# Patient Record
Sex: Male | Born: 1985 | Race: Black or African American | Hispanic: No | Marital: Married | Smoking: Current every day smoker
Health system: Southern US, Community
[De-identification: ages and names within clinical notes are randomized; demographics above are authoritative.]

## PROBLEM LIST (undated history)

## (undated) DIAGNOSIS — F319 Bipolar disorder, unspecified: Secondary | ICD-10-CM

---

## 2013-12-16 ENCOUNTER — Inpatient Hospital Stay
Admit: 2013-12-16 | Discharge: 2013-12-16 | Disposition: A | Discharge status: Home / Self Care | Attending: Family Medicine

## 2013-12-16 DIAGNOSIS — A64 Unspecified sexually transmitted disease: Secondary | ICD-10-CM

## 2013-12-16 LAB — URINALYSIS WITH MICROSCOPIC
Bilirubin Urine: NEGATIVE
Blood, Urine: NEGATIVE
Casts: NONE SEEN /lpf
Casts: NONE SEEN /lpf
Crystals: NONE SEEN
Glucose, Urine: NEGATIVE mg/dl
Ketones, Urine: NEGATIVE
Miscellaneous Lab Test Result: NONE SEEN
Nitrite, Urine: NEGATIVE
Protein, UA: NEGATIVE mg/dl
Renal Epithelial, UA: NONE SEEN
Specific Gravity, UA: 1.026 (ref 1.002–1.03)
Urobilinogen, Urine: 1 eu/dl (ref 0.0–1.0)
WBC, UA: >200 /hpf (ref 0–?)
Yeast, UA: NONE SEEN
pH, UA: 6 (ref 5.0–9.0)

## 2013-12-16 MED ORDER — AZITHROMYCIN 250 MG PO TABS
250 MG | Freq: Once | ORAL | Status: AC
Start: 2013-12-16 — End: 2013-12-16
  Administered 2013-12-16: 17:00:00 1000 mg via ORAL

## 2013-12-16 MED ORDER — LIDOCAINE HCL (PF) 1 % IJ SOLN
1 % | INTRAMUSCULAR | Status: AC
Start: 2013-12-16 — End: 2013-12-16
  Administered 2013-12-16: 17:00:00 2

## 2013-12-16 MED ORDER — CEFTRIAXONE SODIUM 250 MG IJ SOLR
250 MG | Freq: Once | INTRAMUSCULAR | Status: AC
Start: 2013-12-16 — End: 2013-12-16
  Administered 2013-12-16: 17:00:00 250 mg via INTRAMUSCULAR

## 2013-12-16 MED FILL — CEFTRIAXONE SODIUM 250 MG IJ SOLR: 250 MG | INTRAMUSCULAR | Qty: 250

## 2013-12-16 MED FILL — XYLOCAINE-MPF 1 % IJ SOLN: 1 % | INTRAMUSCULAR | Qty: 2

## 2013-12-16 MED FILL — AZITHROMYCIN 250 MG PO TABS: 250 MG | ORAL | Qty: 4

## 2013-12-16 NOTE — ED Provider Notes (Signed)
[facility] EM[facility]      CHIEF COMPLAINT       Chief Complaint   Patient presents with   ??? Penile Discharge     penile swelling, yellow penile discharge       Nurses Notes reviewed and I agree except as noted in the HPI.      HISTORY OF PRESENT ILLNESS    Evan Fischer is a 28 y.o. male who presents with complaints of painful urinating as well as increased urination and whitish discharge from the urethral meatus.  The patient denies any fever, night sweats or shaking chills this time and further denies any nausea, vomiting or diarrhea.  The patient states he has been diagnosed with gonorrhea in the past and that these symptoms are identical in presentation.  The patient denies any blood from the urethral meatus and states he is still able to urinate albeit with pain.  The patient denies any abdominal pain otherwise.    REVIEW OF SYSTEMS     Review of Systems   Constitutional: Negative.    HENT: Negative.    Eyes: Negative.    Respiratory: Negative.    Cardiovascular: Negative.    Gastrointestinal: Negative.    Endocrine: Negative.    Genitourinary: Positive for dysuria, urgency and discharge.   Neurological: Negative.    Hematological: Negative.    Psychiatric/Behavioral: Negative.         PAST MEDICAL HISTORY    has no past medical history on file.    SURGICAL HISTORY      has no past surgical history on file.    CURRENT MEDICATIONS       Previous Medications    No medications on file       ALLERGIES     has No Known Allergies.    FAMILY HISTORY     has no family status information on file.  family history is not on file.    SOCIAL HISTORY          PHYSICAL EXAM     INITIAL VITALS:  height is 5\' 7"  (1.702 m) and weight is 165 lb (74.844 kg). His oral temperature is 97.9 ??F (36.6 ??C). His blood pressure is 153/82 and his pulse is 79. His respiration is 16 and oxygen saturation is 98%.    Physical Exam   Constitutional: He is oriented to person, place, and time. He appears well-developed and  well-nourished. No distress.   HENT:   Head: Normocephalic and atraumatic.   Eyes: Pupils are equal, round, and reactive to light.   Neck: Neck supple.   Cardiovascular: Normal rate, regular rhythm and normal heart sounds.  Exam reveals no gallop and no friction rub.    No murmur heard.  Pulmonary/Chest: Effort normal and breath sounds normal. No respiratory distress. He has no wheezes. He has no rales. He exhibits no tenderness.   Abdominal: Soft. Bowel sounds are normal. He exhibits no distension and no mass. There is no tenderness. There is no rebound and no guarding.   Genitourinary:   Mild penile swelling with whitish discharge appreciated at urethral meatus.  No vesicles appreciated on examination or tender lymphadenopathy.   Musculoskeletal: Normal range of motion.   Neurological: He is alert and oriented to person, place, and time.   Skin: Skin is warm. He is not diaphoretic.       DIFFERENTIAL DIAGNOSIS:   Gonorrhea, chlamydia, herpes, Trichomonas, other concern    DIAGNOSTIC RESULTS     EKG: All  EKG's are interpreted by the Emergency Department Physician who either signs or Co-signs this chart in the absence of a cardiologist.      RADIOLOGY: non-plain film images(s) such as CT, Ultrasound and MRI are read by the radiologist.  Plain radiographic images are visualized and preliminarily interpreted by the emergency physician unless otherwise stated below.      LABS:   Labs Reviewed   URINALYSIS WITH MICROSCOPIC - Abnormal; Notable for the following:     Leukocyte Esterase, Urine LARGE (*)     Character, Urine CLOUDY (*)     All other components within normal limits   NEISSERIA GONORRHOEAE, NAA   CHLAMYDIA TRACHOMATIS, NAA       EMERGENCY DEPARTMENT COURSE:   Vitals:    Filed Vitals:    12/16/13 1105   BP: 153/82   Pulse: 79   Temp: 97.9 ??F (36.6 ??C)   TempSrc: Oral   Resp: 16   Height: 5\' 7"  (1.702 m)   Weight: 165 lb (74.844 kg)   SpO2: 98%     From the patient is most likely suffering from gonorrhea and  chlamydia at this time will be treated while in the emergency room.  I informed him that his urine will be analyzed and evaluated for signs of infection.  I informed the patient that he can return home and should follow-up with his primary care physician and return to the emergency room with any additional questions or concerns any time.  The patient states he is agreeable to this plan and will do so.  CRITICAL CARE:       CONSULTS:  None    PROCEDURES:  None    FINAL IMPRESSION    Sexual transmitted disease    DISPOSITION/PLAN   Discharge home    PATIENT REFERRED TO:  No follow-up provider specified.    DISCHARGE MEDICATIONS:  New Prescriptions    No medications on file       (Please note that portions of this note were completed with a voice recognition program.  Efforts were made to edit the dictations but occasionally words are mis-transcribed.)    [name], MD              [name], MD  12/16/13 1143

## 2013-12-20 LAB — CHLAMYDIA TRACHOMATIS, NAA: C. trachomatis DNA ,Urine: NEGATIVE

## 2013-12-20 LAB — NEISSERIA GONORRHOEAE, NAA: GONORRHOEAE URINE PROBE: POSITIVE — AB

## 2018-01-16 ENCOUNTER — Emergency Department (HOSPITAL_COMMUNITY)
Admission: EM | Admit: 2018-01-16 | Discharge: 2018-01-16 | Disposition: A | Discharge status: Home / Self Care | Payer: Self-pay | Attending: Emergency Medicine | Admitting: Emergency Medicine

## 2018-01-16 ENCOUNTER — Emergency Department (HOSPITAL_COMMUNITY): Payer: Self-pay

## 2018-01-16 ENCOUNTER — Other Ambulatory Visit: Payer: Self-pay

## 2018-01-16 ENCOUNTER — Encounter (HOSPITAL_COMMUNITY): Payer: Self-pay | Admitting: *Deleted

## 2018-01-16 DIAGNOSIS — R059 Cough, unspecified: Secondary | ICD-10-CM

## 2018-01-16 DIAGNOSIS — R05 Cough: Secondary | ICD-10-CM | POA: Insufficient documentation

## 2018-01-16 DIAGNOSIS — R55 Syncope and collapse: Secondary | ICD-10-CM | POA: Insufficient documentation

## 2018-01-16 DIAGNOSIS — F17200 Nicotine dependence, unspecified, uncomplicated: Secondary | ICD-10-CM | POA: Insufficient documentation

## 2018-01-16 NOTE — ED Triage Notes (Signed)
Pt c/o cough x 2 weeks.  Pt stated "I was at the [facility] and it got hard for me to breath tonight."

## 2018-01-16 NOTE — ED Provider Notes (Signed)
[facility]-EMERGENCY DEPT Provider Note   CSN: [id number] Arrival date & time: 01/16/18  0124     History   Chief Complaint Chief Complaint  Patient presents with  . Cough    x 2 weeks  . Homeless    HPI Micheal Walker is a 33 y.o. male.  33 yo M with multiple complaints.  Patient states he has been coughing for a couple weeks and thinks that he may have fallen down.  Thinks he is nauseated but feels that eating food would likely make this much better.  Says is been a while since he had something to eat.  Denies fevers or chills.  Has had some pain to his right great [place]  He has a bandage has been on there for a while and he would like that changed here.  The history is provided by the patient.  Cough  Associated symptoms include headaches and shortness of breath. Pertinent negatives include no chest pain, no chills and no myalgias.  Illness  This is a new problem. The current episode started more than 1 week ago. The problem occurs constantly. The problem has not changed since onset.Associated symptoms include headaches and shortness of breath. Pertinent negatives include no chest pain and no abdominal pain. Nothing aggravates the symptoms. Nothing relieves the symptoms. He has tried nothing for the symptoms. The treatment provided no relief.    History reviewed. No pertinent past medical history.  There are no active problems to display for this patient.   History reviewed. No pertinent surgical history.      Home Medications    Prior to Admission medications   Not on File    Family History No family history on file.  Social History Social History   Tobacco Use  . Smoking status: Current Every Day Smoker    Packs/day: 0.50  . Smokeless tobacco: Never Used  Substance Use Topics  . Alcohol use: Never    Frequency: Never  . Drug use: Yes    Frequency: 7.0 times per week    Types: Cocaine   Comment: Pt stated "I do crack every day"     Allergies   Penicillin g   Review of Systems Review of Systems  Constitutional: Negative for chills and fever.  HENT: Negative for congestion and facial swelling.   Eyes: Negative for discharge and visual disturbance.  Respiratory: Positive for cough and shortness of breath.   Cardiovascular: Negative for chest pain and palpitations.  Gastrointestinal: Negative for abdominal pain, diarrhea and vomiting.  Musculoskeletal: Negative for arthralgias and myalgias.  Skin: Negative for color change and rash.  Neurological: Positive for headaches. Negative for tremors and syncope.  Psychiatric/Behavioral: Negative for confusion and dysphoric mood.     Physical Exam Updated Vital Signs BP (!) 151/81   Pulse 63   Temp 98.2 F (36.8 C) (Oral)   Resp 15   Ht 5' 8.5" (1.74 m)   Wt 72.6 kg   SpO2 100%   BMI 23.97 kg/m   Physical Exam Vitals signs and nursing note reviewed.  Constitutional:      Appearance: He is well-developed.  HENT:     Head: Normocephalic and atraumatic.     Comments: No hematomas appreciated to the posterior aspect of the head. Eyes:     Pupils: Pupils are equal, round, and reactive to light.  Neck:     Musculoskeletal: Normal range of motion and neck supple.  Vascular: No JVD.  Cardiovascular:     Rate and Rhythm: Normal rate and regular rhythm.     Heart sounds: No murmur. No friction rub. No gallop.   Pulmonary:     Effort: No respiratory distress.     Breath sounds: No wheezing.  Abdominal:     General: There is no distension.     Tenderness: There is no guarding or rebound.  Musculoskeletal: Normal range of motion.     Comments: No signs of trauma.  Patient has a chronic appearing ulceration to the plantar aspect of the right great toe.  Pulse motor and sensation is intact to the foot.  Skin:    Coloration: Skin is not pale.     Findings: No rash.  Neurological:     Mental Status: He is alert  and oriented to person, place, and time.     Comments: Patient is neurologically intact is able to ambulate without difficulty  Psychiatric:        Behavior: Behavior normal.      ED Treatments / Results  Labs (all labs ordered are listed, but only abnormal results are displayed) Labs Reviewed - No data to display  EKG EKG Interpretation  Date/Time:  Sunday January 16 2018 08:36:01 EST Ventricular Rate:  59 PR Interval:    QRS Duration: 91 QT Interval:  398 QTC Calculation: 395 R Axis:   81 Text Interpretation:  Sinus rhythm no wpw, prolonged qt or brugada No old tracing to compare Confirmed by ,  ([id number]) on 01/16/2018 8:39:21 AM   Radiology Dg Chest 2 View  Result Date: 01/16/2018 CLINICAL DATA:  Cough 2 weeks. EXAM: CHEST - 2 VIEW COMPARISON:  None. FINDINGS: The heart size and mediastinal contours are within normal limits. Both lungs are clear. The visualized skeletal structures are unremarkable. IMPRESSION: No active cardiopulmonary disease. Electronically Signed   By: iel  Boyle M.D.   On: 01/16/2018 08:17    Procedures Procedures (including critical care time)  Medications Ordered in ED Medications - No data to display   Initial Impression / Assessment and Plan / ED Course  I have reviewed the triage vital signs and the nursing notes.  Pertinent labs & imaging results that were available during my care of the patient were reviewed by me and considered in my medical decision making (see chart for details).     32  yo M with a cc of a possible syncopal event.  I think the patient is likely here for secondary gain as he is homeless and it was very cold last night.  He has no signs of trauma from his fall.  Chest x-ray reviewed by me without focal infiltrate.  EKG without concerning finding for arrhythmia.  Patient grossly neurologically intact.  No vomiting no confusion doubt significant head injury with no signs of trauma.  We will not CT at this time.   Discharge the patient home.  PCP follow-up.  8:42 AM:  I have discussed the diagnosis/risks/treatment options with the patient and family and believe the pt to be eligible for discharge home to follow-up with PCP. We also discussed returning to the ED immediately if new or worsening sx occur. We discussed the sx which are most concerning (e.g., sudden worsening pain, fever, inability to tolerate by mouth) that necessitate immediate return. Medications administered to the patient during their visit and any new prescriptions provided to the patient are listed below.  Medications given during this visit Medications - No data to display  The patient appears reasonably screen and/or stabilized for discharge and I doubt any other medical condition or other [facility] requiring further screening, evaluation, or treatment in the ED at this time prior to discharge.    Final Clinical Impressions(s) / ED Diagnoses   Final diagnoses:  Cough  Syncope and collapse    ED Discharge Orders    None       Melene Plan, DO 01/16/18 [phone]

## 2018-01-16 NOTE — ED Notes (Signed)
Patient provided with ham sandwich and water. Right great toe dressed per order.

## 2018-01-16 NOTE — ED Triage Notes (Signed)
Pt now stating "I got dizzy and fell and hit my head."  Pt denies LOC.

## 2018-01-16 NOTE — ED Notes (Addendum)
Pt now stating "I think I blacked out."  LS clear.

## 2018-01-19 ENCOUNTER — Encounter (HOSPITAL_COMMUNITY): Payer: Self-pay | Admitting: Emergency Medicine

## 2018-01-19 ENCOUNTER — Emergency Department (HOSPITAL_COMMUNITY)
Admission: EM | Admit: 2018-01-19 | Discharge: 2018-01-19 | Disposition: A | Discharge status: Home / Self Care | Payer: Self-pay | Attending: Emergency Medicine | Admitting: Emergency Medicine

## 2018-01-19 ENCOUNTER — Emergency Department (HOSPITAL_COMMUNITY): Payer: Self-pay

## 2018-01-19 DIAGNOSIS — J209 Acute bronchitis, unspecified: Secondary | ICD-10-CM | POA: Insufficient documentation

## 2018-01-19 DIAGNOSIS — F17210 Nicotine dependence, cigarettes, uncomplicated: Secondary | ICD-10-CM | POA: Insufficient documentation

## 2018-01-19 DIAGNOSIS — J40 Bronchitis, not specified as acute or chronic: Secondary | ICD-10-CM

## 2018-01-19 MED ORDER — DOXYCYCLINE HYCLATE 100 MG PO CAPS: 100.0000 mg | ORAL_CAPSULE | Freq: Two times a day (BID) | ORAL | 0 refills | Status: DC

## 2018-01-19 MED ORDER — ALBUTEROL SULFATE HFA 108 (90 BASE) MCG/ACT IN AERS
2.0000 | INHALATION_SPRAY | Freq: Once | RESPIRATORY_TRACT | Status: AC
  Administered 2018-01-19: 2 via RESPIRATORY_TRACT
  Filled 2018-01-19: qty 6.7

## 2018-01-19 MED ORDER — DOXYCYCLINE HYCLATE 100 MG PO TABS
100.0000 mg | ORAL_TABLET | Freq: Once | ORAL | Status: AC
  Administered 2018-01-19: 100 mg via ORAL
  Filled 2018-01-19: qty 1

## 2018-01-19 MED ORDER — DEXAMETHASONE 4 MG PO TABS
16.0000 mg | ORAL_TABLET | Freq: Once | ORAL | Status: AC
  Administered 2018-01-19: 16 mg via ORAL
  Filled 2018-01-19: qty 4

## 2018-01-19 MED ORDER — ALBUTEROL SULFATE (2.5 MG/3ML) 0.083% IN NEBU
5.0000 mg | INHALATION_SOLUTION | Freq: Once | RESPIRATORY_TRACT | Status: AC
  Administered 2018-01-19: 5 mg via RESPIRATORY_TRACT
  Filled 2018-01-19: qty 6

## 2018-01-19 NOTE — ED Provider Notes (Signed)
[facility] EMERGENCY DEPARTMENT Provider Note   CSN: [id number] Arrival date & time: 01/19/18  0330     History   Chief Complaint Chief Complaint  Patient presents with  . Shortness of Breath    HPI Micheal Walker is a 33 y.o. male.  The history is provided by the patient and medical records.  Shortness of Breath  Severity:  Moderate Onset quality:  Gradual Duration:  3 weeks Timing:  Constant Progression:  Worsening Chronicity:  New Context: URI   Context: not activity and not pollens   Relieved by:  None tried Worsened by:  Nothing Ineffective treatments:  None tried Associated symptoms: cough and fever   Associated symptoms: no abdominal pain     History reviewed. No pertinent past medical history.  There are no active problems to display for this patient.   History reviewed. No pertinent surgical history.      Home Medications    Prior to Admission medications   Medication Sig Start Date End Date Taking? Authorizing Provider  doxycycline (VIBRAMYCIN) 100 MG capsule Take 1 capsule (100 mg total) by mouth 2 (two) times daily. One po bid x 7 days 01/19/18   Eyvonne Mechanic, PA-C    Family History No family history on file.  Social History Social History   Tobacco Use  . Smoking status: Current Every Day Smoker    Packs/day: 0.50  . Smokeless tobacco: Never Used  Substance Use Topics  . Alcohol use: Never    Frequency: Never  . Drug use: Yes    Frequency: 7.0 times per week    Types: Cocaine    Comment: Pt stated "I do crack every day"     Allergies   Penicillin g   Review of Systems Review of Systems  Constitutional: Positive for fever.  Respiratory: Positive for cough and shortness of breath.   Gastrointestinal: Negative for abdominal pain.  All other systems reviewed and are negative.    Physical Exam Updated Vital Signs BP 138/74 (BP Location: Right Arm)   Pulse (!) 102   Temp 98 F (36.7 C) (Oral)   Resp  20   SpO2 97%   Physical Exam Vitals signs and nursing note reviewed.  Constitutional:      Appearance: He is well-developed.  HENT:     Head: Normocephalic and atraumatic.  Eyes:     Extraocular Movements: Extraocular movements intact.     Conjunctiva/sclera: Conjunctivae normal.     Pupils: Pupils are equal, round, and reactive to light.  Neck:     Musculoskeletal: Normal range of motion.  Cardiovascular:     Rate and Rhythm: Normal rate.  Pulmonary:     Effort: Pulmonary effort is normal. No respiratory distress.     Breath sounds: Decreased breath sounds present.  Abdominal:     General: Abdomen is flat. There is no distension.  Musculoskeletal: Normal range of motion.  Skin:    General: Skin is warm.  Neurological:     Mental Status: He is alert.     ED Treatments / Results  Labs (all labs ordered are listed, but only abnormal results are displayed) Labs Reviewed - No data to display  EKG EKG Interpretation  Date/Time:  Wednesday January 19 2018 03:36:19 EST Ventricular Rate:  66 PR Interval:  180 QRS Duration: 94 QT Interval:  380 QTC Calculation: 398 R Axis:   88 Text Interpretation:  Normal sinus rhythm Early repolarization Normal ECG No significant change since last tracing  Confirmed by Marily Memos [phone]) on 01/19/2018 8:07:21 AM   Radiology Dg Chest 2 View  Result Date: 01/19/2018 CLINICAL DATA:  Chest pain and shortness of breath for 2 hours. EXAM: CHEST - 2 VIEW COMPARISON:  Radiograph 3 days ago 01/16/2018 FINDINGS: The cardiomediastinal contours are normal. Mild central bronchial thickening. Pulmonary vasculature is normal. No consolidation, pleural effusion, or pneumothorax. No acute osseous abnormalities are seen. IMPRESSION: Mild central bronchial thickening. Electronically Signed   By: Narda Rutherford M.D.   On: 01/19/2018 03:59    Procedures Procedures (including critical care time)   Counseled patient for approximately 7 minutes regarding  smoking cessation. Discussed risks of smoking and how they applied and affected their visit here today. Patient not ready to quit at this time, however will follow up with their primary doctor when they are.   CPT code: 65784: intermediate counseling for smoking cessation   Medications Ordered in ED Medications  albuterol (PROVENTIL) (2.5 MG/3ML) 0.083% nebulizer solution 5 mg (5 mg Nebulization Given 01/19/18 0935)  dexamethasone (DECADRON) tablet 16 mg (16 mg Oral Given 01/19/18 0935)  albuterol (PROVENTIL HFA;VENTOLIN HFA) 108 (90 Base) MCG/ACT inhaler 2 puff (2 puffs Inhalation Given 01/19/18 0936)  doxycycline (VIBRA-TABS) tablet 100 mg (100 mg Oral Given 01/19/18 0936)     Initial Impression / Assessment and Plan / ED Course  I have reviewed the triage vital signs and the nursing notes.  Pertinent labs & imaging results that were available during my care of the patient were reviewed by me and considered in my medical decision making (see chart for details).     Ackley bronchitis.  With 3 weeks of symptoms will treat with antibiotics.  Inhaler and steroids here.  Suspect some of this is exacerbated by being homeless however will give benefit of d0ubt. Counseled on cessation of smoking and smoking crack.   Final Clinical Impressions(s) / ED Diagnoses   Final diagnoses:  Bronchitis    ED Discharge Orders         Ordered    doxycycline (VIBRAMYCIN) 100 MG capsule  2 times daily,   Status:  Discontinued     01/19/18 0919    doxycycline (VIBRAMYCIN) 100 MG capsule  2 times daily     01/19/18 0929           Maritza Hosterman, Barbara Cower, MD 01/19/18 [phone]

## 2018-01-19 NOTE — Discharge Instructions (Addendum)
Please read attached information. If you experience any new or worsening signs or symptoms please return to the emergency room for evaluation. Please follow-up with your primary care provider or specialist as discussed. Please use medication prescribed only as directed and discontinue taking if you have any concerning signs or symptoms.   °

## 2018-01-19 NOTE — ED Triage Notes (Signed)
Pt reports he is having sob b/c "I've been smoking a lot of crack."  Pt does not seem to be having any respiratory distress at this time.

## 2018-02-04 ENCOUNTER — Emergency Department (HOSPITAL_COMMUNITY)
Admission: EM | Admit: 2018-02-04 | Discharge: 2018-02-05 | Disposition: A | Discharge status: Other Acute Inpatient Hospital | Payer: Self-pay | Attending: Emergency Medicine | Admitting: Emergency Medicine

## 2018-02-04 DIAGNOSIS — F1490 Cocaine use, unspecified, uncomplicated: Secondary | ICD-10-CM | POA: Insufficient documentation

## 2018-02-04 DIAGNOSIS — R45851 Suicidal ideations: Secondary | ICD-10-CM | POA: Insufficient documentation

## 2018-02-04 DIAGNOSIS — F17210 Nicotine dependence, cigarettes, uncomplicated: Secondary | ICD-10-CM | POA: Insufficient documentation

## 2018-02-04 DIAGNOSIS — F329 Major depressive disorder, single episode, unspecified: Secondary | ICD-10-CM | POA: Insufficient documentation

## 2018-02-05 ENCOUNTER — Encounter (HOSPITAL_COMMUNITY): Payer: Self-pay | Admitting: Emergency Medicine

## 2018-02-05 ENCOUNTER — Other Ambulatory Visit: Payer: Self-pay

## 2018-02-05 ENCOUNTER — Inpatient Hospital Stay (HOSPITAL_COMMUNITY)
Admission: AD | Admit: 2018-02-05 | Discharge: 2018-02-09 | DRG: 885 | Disposition: A | Discharge status: Home / Self Care | Payer: Federal, State, Local not specified - Other | Source: Intra-hospital | Attending: Psychiatry | Admitting: Psychiatry

## 2018-02-05 DIAGNOSIS — F259 Schizoaffective disorder, unspecified: Secondary | ICD-10-CM | POA: Diagnosis not present

## 2018-02-05 DIAGNOSIS — F419 Anxiety disorder, unspecified: Secondary | ICD-10-CM | POA: Diagnosis present

## 2018-02-05 DIAGNOSIS — F1420 Cocaine dependence, uncomplicated: Secondary | ICD-10-CM | POA: Diagnosis present

## 2018-02-05 DIAGNOSIS — G4700 Insomnia, unspecified: Secondary | ICD-10-CM | POA: Diagnosis present

## 2018-02-05 DIAGNOSIS — J45909 Unspecified asthma, uncomplicated: Secondary | ICD-10-CM | POA: Diagnosis present

## 2018-02-05 DIAGNOSIS — Z880 Allergy status to penicillin: Secondary | ICD-10-CM | POA: Diagnosis not present

## 2018-02-05 DIAGNOSIS — F1210 Cannabis abuse, uncomplicated: Secondary | ICD-10-CM | POA: Diagnosis present

## 2018-02-05 DIAGNOSIS — F1124 Opioid dependence with opioid-induced mood disorder: Secondary | ICD-10-CM | POA: Diagnosis present

## 2018-02-05 DIAGNOSIS — Z560 Unemployment, unspecified: Secondary | ICD-10-CM | POA: Diagnosis not present

## 2018-02-05 DIAGNOSIS — F250 Schizoaffective disorder, bipolar type: Secondary | ICD-10-CM | POA: Diagnosis present

## 2018-02-05 DIAGNOSIS — F17210 Nicotine dependence, cigarettes, uncomplicated: Secondary | ICD-10-CM | POA: Diagnosis present

## 2018-02-05 DIAGNOSIS — Z590 Homelessness: Secondary | ICD-10-CM

## 2018-02-05 DIAGNOSIS — R45851 Suicidal ideations: Secondary | ICD-10-CM | POA: Diagnosis present

## 2018-02-05 DIAGNOSIS — Z635 Disruption of family by separation and divorce: Secondary | ICD-10-CM

## 2018-02-05 LAB — COMPREHENSIVE METABOLIC PANEL
ALT: 15 U/L (ref 0–44)
AST: 18 U/L (ref 15–41)
Albumin: 3.9 g/dL (ref 3.5–5.0)
Alkaline Phosphatase: 62 U/L (ref 38–126)
Anion gap: 11 (ref 5–15)
BUN: 6 mg/dL (ref 6–20)
CHLORIDE: 102 mmol/L (ref 98–111)
CO2: 23 mmol/L (ref 22–32)
CREATININE: 0.98 mg/dL (ref 0.61–1.24)
Calcium: 9.1 mg/dL (ref 8.9–10.3)
GFR calc Af Amer: >60 mL/min (ref >60)
GFR calc non Af Amer: >60 mL/min (ref >60)
Glucose, Bld: 84 mg/dL (ref 70–99)
Potassium: 3.5 mmol/L (ref 3.5–5.1)
Sodium: 136 mmol/L (ref 135–145)
Total Bilirubin: 0.6 mg/dL (ref 0.3–1.2)
Total Protein: 7 g/dL (ref 6.5–8.1)

## 2018-02-05 LAB — CBC
HCT: 41.8 % (ref 39.0–52.0)
Hemoglobin: 13.2 g/dL (ref 13.0–17.0)
MCH: 29.9 pg (ref 26.0–34.0)
MCHC: 31.6 g/dL (ref 30.0–36.0)
MCV: 94.8 fL (ref 80.0–100.0)
Platelets: 170 10*3/uL (ref 150–400)
RBC: 4.41 MIL/uL (ref 4.22–5.81)
RDW: 12.8 % (ref 11.5–15.5)
WBC: 6.6 10*3/uL (ref 4.0–10.5)
nRBC: 0 % (ref 0.0–0.2)

## 2018-02-05 LAB — ACETAMINOPHEN LEVEL: Acetaminophen (Tylenol), Serum: <10 ug/mL — ABNORMAL LOW (ref 10–30)

## 2018-02-05 LAB — ETHANOL: Alcohol, Ethyl (B): <10 mg/dL (ref <10)

## 2018-02-05 LAB — RAPID URINE DRUG SCREEN, HOSP PERFORMED
Amphetamines: NOT DETECTED
Barbiturates: NOT DETECTED
Benzodiazepines: NOT DETECTED
Cocaine: POSITIVE — AB
Opiates: NOT DETECTED
Tetrahydrocannabinol: POSITIVE — AB

## 2018-02-05 LAB — SALICYLATE LEVEL: Salicylate Lvl: <7 mg/dL (ref 2.8–30.0)

## 2018-02-05 MED ORDER — HYDROXYZINE HCL 25 MG PO TABS
25.0000 mg | ORAL_TABLET | Freq: Three times a day (TID) | ORAL | Status: DC | PRN
  Administered 2018-02-05 – 2018-02-08 (×4): 25 mg via ORAL
  Filled 2018-02-05 (×2): qty 1
  Filled 2018-02-05: qty 10
  Filled 2018-02-05 (×3): qty 1

## 2018-02-05 MED ORDER — MAGNESIUM HYDROXIDE 400 MG/5ML PO SUSP: 30.0000 mL | Freq: Every day | ORAL | Status: DC | PRN

## 2018-02-05 MED ORDER — TRAZODONE HCL 50 MG PO TABS
50.0000 mg | ORAL_TABLET | Freq: Every evening | ORAL | Status: DC | PRN
  Administered 2018-02-05 – 2018-02-06 (×2): 50 mg via ORAL
  Filled 2018-02-05 (×2): qty 1

## 2018-02-05 MED ORDER — ACETAMINOPHEN 325 MG PO TABS: 650.0000 mg | ORAL_TABLET | Freq: Four times a day (QID) | ORAL | Status: DC | PRN

## 2018-02-05 MED ORDER — ALUM & MAG HYDROXIDE-SIMETH 200-200-20 MG/5ML PO SUSP: 30.0000 mL | ORAL | Status: DC | PRN

## 2018-02-05 MED ORDER — NICOTINE 21 MG/24HR TD PT24
21.0000 mg | MEDICATED_PATCH | Freq: Every day | TRANSDERMAL | Status: DC
  Administered 2018-02-05 – 2018-02-07 (×3): 21 mg via TRANSDERMAL
  Filled 2018-02-05 (×8): qty 1

## 2018-02-05 NOTE — BH Assessment (Signed)
[facility] Assessment Progress Note    Patient has been accepted to [facility] by Reola Calkins, NP.  Dr. Jama Flavors will be attending.  Report will need to be called to [phone] and patient can arrive at 2 pm.

## 2018-02-05 NOTE — ED Provider Notes (Signed)
[facility] EMERGENCY DEPARTMENT Provider Note   CSN: [id number] Arrival date & time: 02/04/18  2344     History   Chief Complaint Chief Complaint  Patient presents with  . Suicidal    HPI Micheal Walker is a 33 y.o. male.  Patient to ED with suicidal ideation. He reports he plans to overdose on pills. He denies self harm prior to arrival. No HI/AVH. He denies physical complaints.   The history is provided by the patient. No language interpreter was used.    History reviewed. No pertinent past medical history.  There are no active problems to display for this patient.   History reviewed. No pertinent surgical history.      Home Medications    Prior to Admission medications   Medication Sig Start Date End Date Taking? Authorizing Provider  doxycycline (VIBRAMYCIN) 100 MG capsule Take 1 capsule (100 mg total) by mouth 2 (two) times daily. One po bid x 7 days Patient not taking: Reported on 02/05/2018 01/19/18   Eyvonne MechanicHedges, Jeffrey, PA-C    Family History No family history on file.  Social History Social History   Tobacco Use  . Smoking status: Current Every Day Smoker    Packs/day: 0.50  . Smokeless tobacco: Never Used  Substance Use Topics  . Alcohol use: Yes    Frequency: Never  . Drug use: Yes    Frequency: 7.0 times per week    Types: Cocaine    Comment: Pt stated "I do crack every day" and opiates     Allergies   Penicillin g   Review of Systems Review of Systems  Constitutional: Negative for chills and fever.  HENT: Negative.   Respiratory: Negative.   Cardiovascular: Negative.   Gastrointestinal: Negative.   Musculoskeletal: Negative.   Skin: Negative.   Neurological: Negative.   Psychiatric/Behavioral: Positive for dysphoric mood and suicidal ideas.     Physical Exam Updated Vital Signs BP 127/76 (BP Location: Right Arm)   Pulse 65   Temp 97.6 F (36.4 C) (Oral)   Resp 18   SpO2 99%   Physical Exam Vitals  signs and nursing note reviewed.  Constitutional:      Appearance: He is well-developed.  HENT:     Head: Normocephalic.  Neck:     Musculoskeletal: Normal range of motion and neck supple.  Cardiovascular:     Rate and Rhythm: Normal rate and regular rhythm.  Pulmonary:     Effort: Pulmonary effort is normal.     Breath sounds: Normal breath sounds.  Abdominal:     General: Bowel sounds are normal.     Palpations: Abdomen is soft.     Tenderness: There is no abdominal tenderness. There is no guarding or rebound.  Musculoskeletal: Normal range of motion.  Skin:    General: Skin is warm and dry.     Findings: No rash.  Neurological:     Mental Status: He is alert and oriented to person, place, and time.  Psychiatric:        Attention and Perception: He does not perceive auditory hallucinations.        Mood and Affect: Affect is blunt.        Speech: Speech normal.        Behavior: Behavior is slowed.        Thought Content: Thought content includes suicidal ideation. Thought content includes suicidal plan.      ED Treatments / Results  Labs (all labs ordered are  listed, but only abnormal results are displayed) Labs Reviewed  ACETAMINOPHEN LEVEL - Abnormal; Notable for the following components:      Result Value   Acetaminophen (Tylenol), Serum <10 (*)    All other components within normal limits  RAPID URINE DRUG SCREEN, HOSP PERFORMED - Abnormal; Notable for the following components:   Cocaine POSITIVE (*)    Tetrahydrocannabinol POSITIVE (*)    All other components within normal limits  COMPREHENSIVE METABOLIC PANEL  ETHANOL  SALICYLATE LEVEL  CBC    EKG None  Radiology No results found.  Procedures Procedures (including critical care time)  Medications Ordered in ED Medications - No data to display   Initial Impression / Assessment and Plan / ED Course  I have reviewed the triage vital signs and the nursing notes.  Pertinent labs & imaging results  that were available during my care of the patient were reviewed by me and considered in my medical decision making (see chart for details).     Patient voluntarily presents to ED with SI w/plan to overdose. History of suicide attempt in the past.   He is considered medically cleared and has been evaluated by TTS. He is found to meet inpatient criteria and placement is being sought.   He is currently voluntary but if he tries to leave at this point will need to be placed under IVC.   Final Clinical Impressions(s) / ED Diagnoses   Final diagnoses:  None   1. Suicidal ideation   ED Discharge Orders    None       Elpidio AnisUpstill, Davine Sweney, Cordelia Poche-C 02/05/18 [id number]    Glynn Octaveancour, Stephen, MD 02/05/18 [phone]

## 2018-02-05 NOTE — ED Triage Notes (Signed)
Pt states he has been suicidal for the past week with a plan to overdose on pills.  Reports suicidal attempt x 1 in the past.

## 2018-02-05 NOTE — ED Notes (Signed)
Attempted to call report. RN to call back. 

## 2018-02-05 NOTE — ED Notes (Signed)
Ordered diet tray for pt  

## 2018-02-05 NOTE — ED Notes (Signed)
Pt voiced understanding and agreement w/tx plan [facility] - signed [facility] form - [facility], copy sent to Medical Records, and [facility]. ALL belongings - 2 labeled belongings bags - [place] - Pt aware. No Valuables envelope noted.

## 2018-02-05 NOTE — ED Provider Notes (Signed)
33 year old presents for evaluation of SI with plans to overdose on pills. No current HI/AVH. Medically cleared on 02/04/2018. Patient meets inpatient criteria. Admitted to [facility] 303-1 by Money, NP. No complaints at this time. Hemodynamically stable and appropriate for transfer to [facility] at this time.   Coreena Rubalcava A, PA-C 02/05/18 1428    Margarita Grizzle, MD 02/06/18 1224

## 2018-02-05 NOTE — Progress Notes (Cosign Needed)
Patient ID: Micheal Walker, male   DOB: Feb 08, 1985, 33 y.o.   MRN: [id number]  D: Pt alert and oriented during [facility] admission process. Pt denies SI/HI, A/VH, and any pain. Pt is cooperative.  "Micheal Walker is an 33 y.o. separated male who presents unaccompanied to [facility] ED reporting symptoms of depression including suicidal ideation with plan. Pt reports he has a history of schizoaffective disorder, bipolar type and has been off psychiatric medications for several months. He says he has been increasing depressed and anxious for the past week and reports current suicidal ideation with plan to overdose on Percocet. Pt says he has access to Percocet and ingested seven tabs yesterday morning. He says he also has thoughts of hanging himself. Pt acknowledges symptoms including crying spells, social withdrawal, loss of interest in usual pleasures, fatigue, decreased concentration, decreased sleep, decreased appetite and feelings of guilt and hopelessness. He says he feels very sad. He reports auditory hallucinations of voices telling him things but are not command in nature. Pt also reports feeling paranoid and that "someone is after me." He denies current homicidal ideation or history of violence. Pt says he uses crack, marijuana, opiates and alcohol (see below for details of use).  Pt identifies his mental health symptoms as his primary stressor. He says is currently staying at the [facility] shelter and is unemployed. He says he doesn't have a local support system and feels lonely. He identifies his mother and his aunt as his primary supports. He denies any current legal problems. He denies access to firearms.  Pt says he has no current mental health providers. He says he has received medication management through [facility] in the past. He denies any history of inpatinet psychiatric admission."   A: Education, support, reassurance, and encouragement provided, q15 minute safety checks initiated. Pt's belongings  in locker #32 and belongings sheet signed.  R: Pt denies any concerns at this time, and verbally contracts for safety. Pt ambulating on the unit with no issues. Pt remains safe on and off the unit.

## 2018-02-05 NOTE — ED Notes (Signed)
Pt in Rm5. TTS device at bedside, assessment underway.

## 2018-02-05 NOTE — Tx Team (Signed)
Initial Treatment Plan 02/05/2018 6:20 PM Vamsi Biter SFK:[id number]    PATIENT STRESSORS: Financial difficulties Substance abuse Other: Homelessness    PATIENT STRENGTHS: Average or above average intelligence Communication skills Motivation for treatment/growth   PATIENT IDENTIFIED PROBLEMS: "Anxiety"  "Depression"  "Substance Abuse"  "Social Skills"               DISCHARGE CRITERIA:  Ability to meet basic life and health needs Adequate post-discharge living arrangements Improved stabilization in mood, thinking, and/or behavior Reduction of life-threatening or endangering symptoms to within safe limits Verbal commitment to aftercare and medication compliance  PRELIMINARY DISCHARGE PLAN: Outpatient therapy Return to previous living arrangement Referrals indicated:  Homeless shelters/resources  PATIENT/FAMILY INVOLVEMENT: This treatment plan has been presented to and reviewed with the patient, Micheal Walker, and/or family member.  The patient and family have been given the opportunity to ask questions and make suggestions.  Tania Ade, RN 02/05/2018, 6:20 PM
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
## 2018-02-05 NOTE — ED Notes (Signed)
Pt belongings inventoried and kept in NelagoneyLocker #6. No valuables with Security or meds with Pharmacy.

## 2018-02-05 NOTE — ED Notes (Signed)
Pt arrived to Rm 49 - ambulatory - wearing burgundy scrubs. Sitter w/pt. Pt had signed Medical Clearance Policy paperwork.

## 2018-02-06 DIAGNOSIS — F1420 Cocaine dependence, uncomplicated: Secondary | ICD-10-CM | POA: Diagnosis present

## 2018-02-06 DIAGNOSIS — F1124 Opioid dependence with opioid-induced mood disorder: Secondary | ICD-10-CM

## 2018-02-06 DIAGNOSIS — F259 Schizoaffective disorder, unspecified: Secondary | ICD-10-CM

## 2018-02-06 LAB — TSH: TSH: 1.038 u[IU]/mL (ref 0.350–4.500)

## 2018-02-06 MED ORDER — ALBUTEROL SULFATE HFA 108 (90 BASE) MCG/ACT IN AERS
2.0000 | INHALATION_SPRAY | Freq: Four times a day (QID) | RESPIRATORY_TRACT | Status: DC | PRN
  Administered 2018-02-06 – 2018-02-08 (×4): 2 via RESPIRATORY_TRACT
  Filled 2018-02-06 (×2): qty 6.7

## 2018-02-06 NOTE — BHH Group Notes (Signed)
B[facility] LCSW Group Therapy Note  Date/Time:  02/06/2018 9:00-10:00 or 10:00-11:00AM  Type of Therapy and Topic:  Group Therapy:  Healthy and Unhealthy Supports  Participation Level:  Active   Description of Group:  Patients in this group were introduced to the idea of adding a variety of healthy supports to address the various needs in their lives.Patients discussed what additional healthy supports could be helpful in their recovery and wellness after discharge in order to prevent future hospitalizations.   An emphasis was placed on using counselor, doctor, therapy groups, 12-step groups, and problem-specific support groups to expand supports.  They also worked as a group on developing a specific plan for several patients to deal with unhealthy supports through boundary-setting, psychoeducation with loved ones, and even termination of relationships.   Therapeutic Goals:   1)  discuss importance of adding supports to stay well once out of the hospital  2)  compare healthy versus unhealthy supports and identify some examples of each  3)  generate ideas and descriptions of healthy supports that can be added  4)  offer mutual support about how to address unhealthy supports  5)  encourage active participation in and adherence to discharge plan    Summary of Patient Progress:  The patient stated that current healthy supports in his life are family while current unhealthy supports include drugs and food.  The patient expressed a willingness to add therapy as support to help in his recovery journey.   Therapeutic Modalities:   Motivational Interviewing Brief Solution-Focused Therapy  Evorn Gong

## 2018-02-06 NOTE — Progress Notes (Signed)
Patient has been up and active on the unit, he did not attended group this evening and has voiced no complaints. He has been very restless and anxious tonight but pleasant. Patient currently denies having pain, -si/hi/a/v hall. Support and encouragement offered, safety maintained on unit, will continue to monitor.

## 2018-02-06 NOTE — Progress Notes (Signed)
Patient did attend the evening speaker AA meeting.  

## 2018-02-06 NOTE — [facility] Suicide Risk Assessment (Signed)
[facility]   Nursing information obtained from:  Patient Demographic factors:  Male, Living alone, Low socioeconomic status Current Mental Status:  Suicidal ideation indicated by patient Loss Factors:  Decrease in vocational status, Financial problems / change in socioeconomic status Historical Factors:  NA Risk Reduction Factors:  Positive coping skills or problem solving skills  Total Time spent with patient: 45 minutes Principal Problem: Schizoaffective disorder (HCC) Diagnosis:  Principal Problem:   Schizoaffective disorder (HCC) Active Problems:   Cocaine use disorder, severe, dependence (HCC)  Subjective Data:   Continued Clinical Symptoms:  Alcohol Use Disorder Identification Test Final Score (AUDIT): 16 The "Alcohol Use Disorders Identification Test", Guidelines for Use in Primary Care, Second Edition.  World Science writerHealth Organization [facility]WHO). Score between 0-7:  no or low risk or alcohol related problems. Score between 8-15:  moderate risk of alcohol related problems. Score between 16-19:  high risk of alcohol related problems. Score 20 or above:  warrants further diagnostic evaluation for alcohol dependence and treatment.   CLINICAL FACTORS:  [age], married, has a 32 year old daughter, currently separated , [facility] recent [facility]. Currently unemployed.Denies legal issues.  Patient presented to ED voluntarily yesterday , reporting worsening depression, neuro-vegetative symptoms of depression, suicidal ideations, with thoughts of overdosing . Endorses neuro-vegetative symptoms- anhedonia, poor sleep, poor energy level. Denies any psychotic symptoms. Reports history of substance abuse- currently endorses opiate abuse, states takes opiates ( mainly percocet) which he sometimes crushes and insufflates . At this time denies alcohol, benzodiazepine abuse . Admission UDS positive for Cocaine and THC, negative for Opiates, admission BAL negative.   History of prior psychiatric admissions , and reports he had been admitted to a psychiatric unit in [place] for anxiety and depression a few weeks ago. Chart notes indicate history of Schizoaffective Disorder Diagnosis. At this time patient endorses episodes of depression, episodes of increased energy/insomnia, and occasional hallucinations, but attributes these to substance abuse, and points out " I get like that when I am using crack cocaine ". States mood tends to improve during periods of sobriety.  Medical History- history of Asthma for which he uses Albuterol Inhaler PRN. Allergic to PCN. He was not taking any medications prior to admission.  Dx- Opiate , Cocaine Use Disorder. Substance Induced Mood Disorder versus MDD.  Plan- Inpatient treatment .  We discussed medication options and offered opiate detox protocol ( Clonidine detox protocol). Patient states he is already feeling better and does not want to start above medications at this time. Similarly , he is not currently interested in starting an antidepressant or standing psychiatric medication at this time. He does state Vistaril and Trazodone PRNs have been well tolerated and helpful.      Musculoskeletal: Strength & Muscle Tone: within normal limits no tremors, no diaphoresis Gait & Station: normal Patient leans: N/A  Psychiatric Specialty Exam: Physical Exam  ROS denies headache, no chest pain , no shortness of breath, no nausea, no vomiting, no diarrhea, no rash, describes myalgias, cramps related to WDL. No fever, no chills  Blood pressure 107/74, pulse 79, temperature 97.9 F (36.6 C), temperature source Oral, resp. rate 18, height 5\' 9"  (1.753 m), weight 77.6 kg, SpO2 100 %.Body mass index is 25.25 kg/m.  General Appearance: Fairly Groomed  Eye Contact:  Good  Speech:  Normal Rate  Volume:  Decreased  Mood:  reports he is feeling better today, presents depressed   Affect:  constricted, but reactive  Thought  Process:   Linear and Descriptions of Associations: Intact  Orientation:  Full (Time, Place, and Person)  Thought Content:  denies hallucinations, no delusions, not internally preoccupied   Suicidal Thoughts:  No denies any suicidal or self injurious ideations at this time, and contracts for safety on unit, denies homicidal or violent ideations  Homicidal Thoughts:  No  Memory:  recent and remote grossly intact   Judgement:  Fair  Insight:  Fair  Psychomotor Activity:  Decreased- no current psychomotor agitation or restlessness, does not appear to be in any acute distress or discomfort   Concentration:  Concentration: Good and Attention Span: Good  Recall:  Good  Fund of Knowledge:  Good  Language:  Good  Akathisia:  Negative  Handed:  Left  AIMS (if indicated):     Assets:  Communication Skills Desire for Improvement Resilience  ADL's:  Intact  Cognition:  WNL  Sleep:  Number of Hours: 6.75      COGNITIVE FEATURES THAT CONTRIBUTE TO RISK:  Closed-mindedness and Loss of executive function    SUICIDE RISK:   Moderate:  Frequent suicidal ideation with limited intensity, and duration, some specificity in terms of plans, no associated intent, good self-control, limited dysphoria/symptomatology, some risk factors present, and identifiable protective factors, including available and accessible social support.  PLAN OF CARE: Patient will be admitted to inpatient psychiatric unit for stabilization and safety. Will provide and encourage milieu participation. Provide medication management and maked adjustments as needed.  Will follow daily.    I certify that inpatient services furnished can reasonably be expected to improve the patient's condition.   Craige Cotta, MD 02/06/2018, 4:28 PM

## 2018-02-06 NOTE — Progress Notes (Signed)
D. Pt stayed in bed for much of the am, but attended am group. Pt is calm and cooperative, friendly during interactions- voices no complaints. Per pt's self inventory, pt rates his depression and hopelessness both 7's. Pt writes that his goal today is "AA, movie" and writes that "he will "work hard" to help him meet that goal. Pt currently denies SI/HI and AV hallucinations A. Labs and vitals monitored. Pt supported emotionally and encouraged to express concerns and ask questions.   R. Pt remains safe with 15 minute checks. Will continue POC.

## 2018-02-06 NOTE — B[facility] Group Notes (Signed)
[facility] Group Notes:  (Nursing)  Date:  02/06/2018  Time: 200 PM Type of Therapy:  Nurse Education  Participation Level:  Active  Participation Quality:  Appropriate and Attentive  Affect:  Appropriate  Cognitive:  Appropriate  Insight:  Appropriate  Engagement in Group:  Engaged  Modes of Intervention:  Discussion and Education  Summary of Progress/Problems: Life Skills group led by Nurse- Anger Management Shela Nevin 02/06/2018, 5:52 PM

## 2018-02-06 NOTE — Progress Notes (Signed)
Patient has been up and active on the unit, attended group this evening and has voiced no complaints. Patient currently denies having pain, -si/hi/a/v hall. Support and encouragement offered, safety maintained on unit, will continue to monitor.  

## 2018-02-06 NOTE — H&P (Addendum)
Psychiatric Admission Assessment Adult  Patient Identification: Micheal Walker MRN:  [id number] Date of Evaluation:  02/06/2018 Chief Complaint:  SCHIZOAFFECTIVE DISORDER COCAINE USE OPIOID USE Principal Diagnosis: Schizoaffective disorder ([place]) Diagnosis:  Principal Problem:   Schizoaffective disorder ([place]) Active Problems:   Cocaine use disorder, severe, dependence ([place])  History of Present Illness: From TTS counselor note on 02-05-2018: Micheal Walker is an 33 y.o. separated male who presents unaccompanied to [facility] reporting symptoms of depression including suicidal ideation with plan. Pt reports he has a history of schizoaffective disorder, bipolar type and has been off psychiatric medications for several months. He says he has been increasing depressed and anxious for the past week and reports current suicidal ideation with plan to overdose on Percocet. Pt says he has access to Percocet and ingested seven tabs yesterday morning. He says he also has thoughts of hanging himself. Pt acknowledges symptoms including crying spells, social withdrawal, loss of interest in usual pleasures, fatigue, decreased concentration, decreased sleep, decreased appetite and feelings of guilt and hopelessness. He says he feels very sad. He reports auditory hallucinations of voices telling him things but are not command in nature. Pt also reports feeling paranoid and that "someone is after me." He denies current homicidal ideation or history of violence. Pt says he uses crack, marijuana, opiates and alcohol (see below for details of use).  Pt identifies his mental health symptoms as his primary stressor. He says is currently staying at the [facility] shelter and is unemployed. He says he doesn't have a local support system and feels lonely. He identifies his mother and his aunt as his primary supports. He denies any current legal problems. He denies access to firearms.  Pt says he has no current mental health  providers. He says he has received medication management through [facility] in the past. He denies any history of inpatinet psychiatric admission.  Today on evaluation:  Micheal Walker is found in the day room interacting appropriately with other patients and playing cards. He is pleasant ans smiling on approach. Pt stated he came to the hospital because of drug abuse and he wants to get help. He stated he has no family here in [place] and his family is all over the place. He stated he has a 17 year old daughter in [place] but has no contact with her. He stated he was hospitalized at [facility] one month ago but there is no evidence of this in care everywhere. He stated his drugs of choice ar percocet and crack cocaine. UDS positive for cocaine and THC/BAL negative. He stated he has been living at the [facility] for the past 2 months. He appears pleasant and smiling on approach. He endorses no depressive symptoms and denies anxiety. He appears to be minimizing but stated he is grateful to be here and get help. He denies suicidal/homicidal ideations and denies hallucinations. He did report to the TTS counselor that he was suicidal with a plan to overdose on Percocet and endorsed depressive symptoms ofcrying spells, social withdrawal, loss of interest in usual pleasures, fatigue, decreased concentration, decreased sleep, decreased appetite and feelings of guilt and hopelessness. Will continue to monitor for safety.   No pertinent medical history Labs reviewed, WNL, TSH 1.038 WNL. UDS positive for cocaine and THC   Associated Signs/Symptoms: Depression Symptoms:  depressed mood, feelings of worthlessness/guilt, hopelessness, suicidal thoughts without plan, disturbed sleep, (Hypo) Manic Symptoms:  None Identified Anxiety Symptoms:  None Identified Psychotic Symptoms:  None Identified PTSD Symptoms: None identifed  Total Time spent with patient: 30 minutes  Past Psychiatric History: Schizoaffective disorder  Is  the patient at risk to self? Yes.    Has the patient been a risk to self in the past 6 months? Yes.    Has the patient been a risk to self within the distant past? No.  Is the patient a risk to others? No.  Has the patient been a risk to others in the past 6 months? No.  Has the patient been a risk to others within the distant past? No.   Prior Inpatient Therapy:  No, denied Prior Outpatient Therapy:    Alcohol Screening: 1. How often do you have a drink containing alcohol?: 4 or more times a week 2. How many drinks containing alcohol do you have on a typical day when you are drinking?: 1 or 2 3. How often do you have six or more drinks on one occasion?: Less than monthly AUDIT-C Score: 5 4. How often during the last year have you found that you were not able to stop drinking once you had started?: Less than monthly 5. How often during the last year have you failed to do what was normally expected from you becasue of drinking?: Less than monthly 6. How often during the last year have you needed a first drink in the morning to get yourself going after a heavy drinking session?: Less than monthly 7. How often during the last year have you had a feeling of guilt of remorse after drinking?: Monthly 8. How often during the last year have you been unable to remember what happened the night before because you had been drinking?: Monthly 9. Have you or someone else been injured as a result of your drinking?: No 10. Has a relative or friend or a doctor or another health worker been concerned about your drinking or suggested you cut down?: Yes, during the last year Alcohol Use Disorder Identification Test Final Score (AUDIT): 16 Alcohol Brief Interventions/Follow-up: Alcohol Education, Continued Monitoring Substance Abuse History in the last 12 months:  Yes.   Consequences of Substance Abuse: Family Consequences:  homeless, estrangement from family Previous Psychotropic Medications: No   Psychological Evaluations: No  Past Medical History: History reviewed. No pertinent past medical history. History reviewed. No pertinent surgical history. Family History: History reviewed. No pertinent family history. Family Psychiatric  History:  Tobacco Screening:   Social History:  Social History   Substance and Sexual Activity  Alcohol Use Yes  . Frequency: Never     Social History   Substance and Sexual Activity  Drug Use Yes  . Frequency: 7.0 times per week  . Types: Cocaine   Comment: Pt stated "I do crack every day" and opiates    Additional Social History: Marital status: Separated Number of Years Married: 8 Separated, when?: 5 years ago What types of issues is patient dealing with in the relationship?: "just alot of issues."  Additional relationship information: "We don't have contact."  Are you sexually active?: Yes What is your sexual orientation?: heterosexual Has your sexual activity been affected by drugs, alcohol, medication, or emotional stress?: n/a Does patient have children?: Yes How many children?: 1 How is patient's relationship with their children?: 46yo daughter. "I don't see her at all. I haven't had contact with her."      Allergies:   Allergies  Allergen Reactions  . Penicillin G Anaphylaxis    DID THE REACTION INVOLVE: Swelling of the face/tongue/throat, SOB, or low BP? Yes Sudden  or severe rash/hives, skin peeling, or the inside of the mouth or nose? Yes Did it require medical treatment? Yes When did it last happen?05/2017 If all above answers are "NO", may proceed with cephalosporin use.   Lab Results:  Results for orders placed or performed during the hospital encounter of 02/05/18 (from the past 48 hour(s))  TSH     Status: None   Collection Time: 02/06/18  6:40 AM  Result Value Ref Range   TSH 1.038 0.350 - 4.500 uIU/mL    Comment: Performed by a 3rd Generation assay with a functional sensitivity of <=0.01 uIU/mL. Performed at  [facility], [place] [address] [place], [place] [address]     Blood Alcohol level:  Lab Results  Component Value Date   ETH <10 09/62/8366    Metabolic Disorder Labs:  No results found for: HGBA1C, MPG No results found for: PROLACTIN No results found for: CHOL, TRIG, HDL, CHOLHDL, VLDL, LDLCALC  Current Medications: Current Facility-Administered Medications  Medication Dose Route Frequency Provider Last Rate Last Dose  . acetaminophen (TYLENOL) tablet 650 mg  650 mg Oral Q6H PRN Money, Lowry Ram, FNP      . alum & mag hydroxide-simeth (MAALOX/MYLANTA) 200-200-20 MG/5ML suspension 30 mL  30 mL Oral Q4H PRN Money, Lowry Ram, FNP      . hydrOXYzine (ATARAX/VISTARIL) tablet 25 mg  25 mg Oral TID PRN Money, Lowry Ram, FNP   25 mg at 02/05/18 2115  . magnesium hydroxide (MILK OF MAGNESIA) suspension 30 mL  30 mL Oral Daily PRN Money, Darnelle Maffucci B, FNP      . nicotine (NICODERM CQ - dosed in mg/24 hours) patch 21 mg  21 mg Transdermal Daily Money, Lowry Ram, FNP   21 mg at 02/05/18 2005  . traZODone (DESYREL) tablet 50 mg  50 mg Oral QHS PRN Money, Lowry Ram, FNP   50 mg at 02/05/18 2115   PTA Medications: Medications Prior to Admission  Medication Sig Dispense Refill Last Dose  . doxycycline (VIBRAMYCIN) 100 MG capsule Take 1 capsule (100 mg total) by mouth 2 (two) times daily. One po bid x 7 days (Patient not taking: Reported on 02/05/2018) 14 capsule 0 Completed Course at Unknown time    Musculoskeletal: Strength & Muscle Tone: within normal limits Gait & Station: normal Patient leans: N/A  Psychiatric Specialty Exam: Physical Exam  Constitutional: He is oriented to person, place, and time. He appears well-developed and well-nourished.  HENT:  Head: Normocephalic.  Respiratory: Effort normal.  Musculoskeletal: Normal range of motion.  Neurological: He is alert and oriented to person, place, and time.  Psychiatric: He has a normal mood and affect. His speech is normal  and behavior is normal. Thought content normal. Cognition and memory are normal.    ROS  Blood pressure 107/74, pulse 79, temperature 97.9 F (36.6 C), temperature source Oral, resp. rate 18, height 5' 9" (1.753 m), weight 77.6 kg, SpO2 100 %.Body mass index is 25.25 kg/m.  General Appearance: Casual  Eye Contact:  Good  Speech:  Clear and Coherent and Normal Rate  Volume:  Normal  Mood:  Euthymic  Affect:  Congruent  Thought Process:  Coherent, Linear and Descriptions of Associations: Intact  Orientation:  Full (Time, Place, and Person)  Thought Content:  Logical and minimizing  Suicidal Thoughts:  No  Homicidal Thoughts:  No  Memory:  Immediate;   Good Recent;   Good Remote;   Fair  Judgement:  Fair  Insight:  Shallow  Psychomotor Activity:  Normal  Concentration:  Concentration: Good and Attention Span: Good  Recall:  [place] of Knowledge:  Good  Language:  Good  Akathisia:  No  Handed:  Right  AIMS (if indicated):     Assets:  Communication Skills Desire for Improvement  ADL's:  Intact  Cognition:  WNL  Sleep:  Number of Hours: 6.75    Treatment Plan Summary: Daily contact with patient to assess and evaluate symptoms and progress in treatment and Medication management  Observation Level/Precautions:  15 minute checks  Laboratory:  Labs reviewed: WNL  Psychotherapy:  Attend group therapy  Medications:  See MAR  Consultations:  TBD  Discharge Concerns:  Safety, homelessness, substance abuse  Estimated LOS: 5-7 days  Other:     Physician Treatment Plan for Primary Diagnosis: Schizoaffective disorder ([place]) Long Term Goal(s): Improvement in symptoms so as ready for discharge  Short Term Goals: Ability to identify changes in lifestyle to reduce recurrence of condition will improve, Ability to disclose and discuss suicidal ideas, Ability to identify and develop effective coping behaviors will improve, Compliance with prescribed medications will improve and Ability  to identify triggers associated with substance abuse/mental health issues will improve  Physician Treatment Plan for Secondary Diagnosis: Active Problems:   Schizoaffective disorder ([place])  Long Term Goal(s): Improvement in symptoms so as ready for discharge  Short Term Goals: Ability to identify changes in lifestyle to reduce recurrence of condition will improve, Ability to disclose and discuss suicidal ideas, Ability to identify and develop effective coping behaviors will improve, Compliance with prescribed medications will improve and Ability to identify triggers associated with substance abuse/mental health issues will improve  I certify that inpatient services furnished can reasonably be expected to improve the patient's condition.    Ethelene Hal, NP 1/26/20201:11 PM   I have discussed case with NP and have met with patient  Agree with NP note and assessment  70, married, has a 8 year old daughter, currently separated , has been living at [facility] over recent weeks. Currently unemployed.Denies legal issues.  Patient presented to ED voluntarily yesterday , reporting worsening depression, neuro-vegetative symptoms of depression, suicidal ideations, with thoughts of overdosing . Endorses neuro-vegetative symptoms- anhedonia, poor sleep, poor energy level. Denies any psychotic symptoms. Reports history of substance abuse- currently endorses opiate abuse, states takes opiates ( mainly percocet) which he sometimes crushes and insufflates . At this time denies alcohol, benzodiazepine abuse . Admission UDS positive for Cocaine and THC, negative for Opiates, admission BAL negative.  History of prior psychiatric admissions , and reports he had been admitted to a psychiatric unit in [place] for anxiety and depression a few weeks ago. Chart notes indicate history of Schizoaffective Disorder Diagnosis. At this time patient endorses episodes of depression, episodes of increased energy/insomnia, and  occasional hallucinations, but attributes these to substance abuse, and points out " I get like that when I am using crack cocaine ". States mood tends to improve during periods of sobriety.  Medical History- history of Asthma for which he uses Albuterol Inhaler PRN. Allergic to PCN. He was not taking any medications prior to admission.  Dx- Opiate , Cocaine Use Disorder. Substance Induced Mood Disorder versus MDD.  Plan- Inpatient treatment .  We discussed medication options and offered opiate detox protocol ( Clonidine detox protocol). Patient states he is already feeling better and does not want to start above medications at this time. Similarly , he is not currently interested in  starting an antidepressant or standing psychiatric medication at this time. He does state Vistaril and Trazodone PRNs have been well tolerated and helpful.

## 2018-02-06 NOTE — BHH Counselor (Signed)
Adult Comprehensive Assessment  Patient ID: Micheal Walker, male   DOB: 1985/09/01, 33 y.o.   MRN: [id number]  Information Source: Information source: Patient  Current Stressors:   Homeless Unemployed; no financial supports Limited social supports--mother/sister Substance abuse-crack cocaine daily x2 years, opioid/pain pill abuse "almost every day for a few years," marijuana use 3-4x weeklyl. occassional alcohol abuse.  Living/Environment/Situation:  Living Arrangements: Alone, Other (Comment) Living conditions (as described by patient or guardian): homeless Who else lives in the home?: other homeless people How long has patient lived in current situation?: several months "maybe a year or more."  What is atmosphere in current home: Chaotic, Temporary  Family History:  Marital status: Separated Number of Years Married: 8 Separated, when?: 5 years ago What types of issues is patient dealing with in the relationship?: "just alot of issues."  Additional relationship information: "We don't have contact."  Are you sexually active?: Yes What is your sexual orientation?: heterosexual Has your sexual activity been affected by drugs, alcohol, medication, or emotional stress?: n/a Does patient have children?: Yes How many children?: 1 How is patient's relationship with their children?: 16yo daughter. "I don't see her at all. I haven't had contact with her."   Childhood History:  By whom was/is the patient raised?: Mother, Grandparents Additional childhood history information: parents divorced when pt was young. mom was primary caretaker. "I had a good childhood."  Description of patient's relationship with caregiver when they were a child: close to both parents Patient's description of current relationship with people who raised him/her: "My mom and I talk every once in a while." "Dad died when I was 84."  How were you disciplined when you got in trouble as a child/adolescent?: yelled at;  grounded; whoopings  Does patient have siblings?: Yes Number of Siblings: 2 Description of patient's current relationship with siblings: middle child. "I have a brother and a sister. They are doing well."  Did patient suffer any verbal/emotional/physical/sexual abuse as a child?: No Did patient suffer from severe childhood neglect?: No Has patient ever been sexually abused/assaulted/raped as an adolescent or adult?: No Was the patient ever a victim of a crime or a disaster?: No Witnessed domestic violence?: No Has patient been effected by domestic violence as an adult?: No  Education:  Highest grade of school patient has completed: some college. Currently a student?: No Learning disability?: No  Employment/Work Situation:   Employment situation: Unemployed Patient's job has been impacted by current illness: Yes Describe how patient's job has been impacted: "My mental illness gets in the way. 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  Did You Receive Any Psychiatric Treatment/Services While in the Military?: No(n/a) Are There Guns or Other Weapons in Your Home?: No Are These Weapons Safely Secured?: (n/a)  Financial Resources:   Financial resources: No income Does patient have a Lawyer or guardian?: No  Alcohol/Substance Abuse:   What has been your use of drugs/alcohol within the last 12 months?: alcohol-few times a month. Marijuana-few times a week. crack cocaine-every day I smoke crack for the past few years. pain pill/opiates-every few days. History of IV heroin use but uses primary pain medication.  If attempted suicide, did drugs/alcohol play a role in this?: Yes(one month ago-"I tried to hang myself." ) Alcohol/Substance Abuse Treatment Hx: Past Tx, Inpatient If yes, describe treatment: [facility] after attempting to hang myself  last month. "I was  supposed to go to [facility] but didn't make it."  Has alcohol/substance abuse ever caused legal problems?: No  Social Support System:   Patient's Community Support System: [employer]/gallery exhibitions officer System: a few good friends. mother is supportive.  Type of faith/religion: [employer] How does patient's faith help to cope with current illness?: prayer  Leisure/Recreation:   Leisure and Hobbies: basketball and watching movies  Strengths/Needs:   What is the patient's perception of their strengths?: kind; try to do for myself Patient states they can use these personal strengths during their treatment to contribute to their recovery: "I'm willing to try medicine and try [facility] for [facility]." Patient states these barriers may affect/interfere with their treatment: no income; no insurance; very limited social supports Patient states these barriers may affect their return to the community: none identified. "I think I can get back into the shelter."  Other important information patient would like considered in planning for their treatment: none noted by pt.   Discharge Plan:   Currently receiving community mental health services: No Patient states concerns and preferences for aftercare planning are: [facility]; would like a referral to [facility]  Patient states they will know when they are safe and ready for discharge when: when I feel less depressed.  Does patient have access to transportation?: Yes(bus) Does patient have financial barriers related to discharge medications?: Yes Patient description of barriers related to discharge medications: no income/ no insurance.  Will patient be returning to same living situation after discharge?: Yes(pt likely will return to [facility] shelter at discharge. )  Summary/Recommendations:   Summary and Recommendations (to be completed by the evaluator): Patient is 32yo male who identifies as homeless in [place], [place] He presents to the hospital seeking treatment for SI, depression/mood lability, crack cocaine/marijuana/opioid abuse, and for medication stabilization. Pt has a history of schizoaffective disorder. He reports that he is separated, with a 16yo daughter, and is unemployed. Pt has been staying at a local shelter and was recently hospitalized with similar issues last month at [facility]. Recommendations for pt include: crisis stabilization, therapeutic milieu, encourage group attendance and participation, meidcation management for detox/mood stabilization, and development of comprehensive mental wellness/sobriety plan. CSW assessing for appropriate referrals. Pt is interested in [facility] referral and [facility] for outpatient mental health services.   Rona Ravens LCSW 02/06/2018 10:08 AM

## 2018-02-07 MED ORDER — RISPERIDONE 1 MG PO TBDP
1.0000 mg | ORAL_TABLET | Freq: Two times a day (BID) | ORAL | Status: DC
  Administered 2018-02-07 – 2018-02-09 (×4): 1 mg via ORAL
  Filled 2018-02-07 (×5): qty 1
  Filled 2018-02-07 (×2): qty 14
  Filled 2018-02-07 (×2): qty 1

## 2018-02-07 MED ORDER — MIRTAZAPINE 7.5 MG PO TABS
7.5000 mg | ORAL_TABLET | Freq: Every day | ORAL | Status: DC
  Administered 2018-02-07 – 2018-02-08 (×2): 7.5 mg via ORAL
  Filled 2018-02-07: qty 1
  Filled 2018-02-07: qty 7
  Filled 2018-02-07 (×3): qty 1

## 2018-02-07 NOTE — Progress Notes (Addendum)
Patient ID: Micheal Walker, male   DOB: Oct 29, 1985, 34 y.o.   MRN: [id number] D: Patient observed watching TV and interacting well with peers on approach. Pt reports he had a good day and enjoyed AA group evening. Denies  SI/HI/AVH and pain.   A: Support and encouragement offered as needed to express needs. Medications administered as prescribed. Pt had to be moved to another hall for inappropriate behavior involving entering a male pt's room. R: Patient is safe and cooperative on unit. Will continue to monitor  for safety and stability.

## 2018-02-07 NOTE — [facility] Group Notes (Signed)
LCSW Group Therapy Note   02/07/2018 1:15pm   Type of Therapy and Topic:  Group Therapy:  Overcoming Obstacles   Participation Level:  Minimal   Description of Group:    In this group patients will be encouraged to explore what they see as obstacles to their own wellness and recovery. They will be guided to discuss their thoughts, feelings, and behaviors related to these obstacles. The group will process together ways to cope with barriers, with attention given to specific choices patients can make. Each patient will be challenged to identify changes they are motivated to make in order to overcome their obstacles. This group will be process-oriented, with patients participating in exploration of their own experiences as well as giving and receiving support and challenge from other group members.   Therapeutic Goals: 1. Patient will identify personal and current obstacles as they relate to admission. 2. Patient will identify barriers that currently interfere with their wellness or overcoming obstacles.  3. Patient will identify feelings, thought process and behaviors related to these barriers. 4. Patient will identify two changes they are willing to make to overcome these obstacles:      Summary of Patient Progress   Pt came to group late and did not actively participate in group discussion. When prompted, he shared with CSW that he feels sick and thinks it may be due to withdrawals. He continues to endorse SI thoughts but stated that he could keep himself safe. Pt covered in blanket and did not wish to share further. At this time, pt continues to demonstrate limited insight and progress in the group setting.    Therapeutic Modalities:   Cognitive Behavioral Therapy Solution Focused Therapy Motivational Interviewing Relapse Prevention Therapy  Rona RavensHeather S Tracye Szuch, LCSW 02/07/2018 11:00 AM

## 2018-02-07 NOTE — Progress Notes (Signed)
[facility] MD Progress Note  02/07/2018 4:01 PM Micheal Walker  MRN:  [id number] Subjective: Patient reports some depression, which he attributes in part to poor family/support network.  He also reports a subjective sense of brief mood swings, and endorses intermittent auditory hallucinations. States he hears a voice telling him to be happy and to laugh.  Objective: I have discussed case with treatment team and have met with patient.  33 year old male, separated, homeless, has been living at [facility]. Presented voluntarily due to worsening depression, neurovegetative symptoms, suicidal thoughts of overdosing.  Initially denied psychotic symptoms but, as above, now endorses intermittent auditory hallucinations.  Reports history of abusing opiate analgesics, admission drug screen was positive for cocaine, cannabis, negative for opiates.  He reports a prior history of psychiatric admissions and chart notes indicate a prior diagnosis of schizoaffective disorder.  At this time patient presents depressed, vaguely anxious, has been noted to exhibit inappropriate affect at times, chuckling briefly for no apparent reason.  He does endorse a subjective feeling of short-lived mood swings but overall describes persistent depression.  Denies suicidal ideations. As above, describes intermittent auditory hallucinations.  Denies command hallucinations.  Does not currently appear internally preoccupied, but does appear vaguely guarded/cautious in demeanor.  Yesterday had declined being started on standing psychiatric medications.  At this time reports he is interested in medications to address his symptoms as above.  We reviewed history.  He states that in the past combination of risperidone and Remeron was effective and well-tolerated.  Visible in milieu, pleasant on approach, limited interaction in groups or with peers.  Principal Problem: Schizoaffective disorder ([place]) Diagnosis: Principal Problem:   Schizoaffective disorder  ([place]) Active Problems:   Cocaine use disorder, severe, dependence (HCC)   Opioid dependence with opioid-induced mood disorder ([employer])  Total Time spent with patient: 20 minutes  Past Psychiatric History:   Past Medical History: History reviewed. No pertinent past medical history. History reviewed. No pertinent surgical history. Family History: History reviewed. No pertinent family history. Family Psychiatric  History:  Social History:  Social History   Substance and Sexual Activity  Alcohol Use Yes  . Frequency: Never     Social History   Substance and Sexual Activity  Drug Use Yes  . Frequency: 7.0 times per week  . Types: Cocaine   Comment: Pt stated "I do crack every day" and opiates    Social History   Socioeconomic History  . Marital status: Married    Spouse name: Not on file  . Number of children: Not on file  . Years of education: Not on file  . Highest education level: Not on file  Occupational History  . Not on file  Social Needs  . Financial resource strain: Not on file  . Food insecurity:    Worry: Not on file    Inability: Not on file  . Transportation needs:    Medical: Not on file    Non-medical: Not on file  Tobacco Use  . Smoking status: Current Every Day Smoker    Packs/day: 0.50  . Smokeless tobacco: Never Used  Substance and Sexual Activity  . Alcohol use: Yes    Frequency: Never  . Drug use: Yes    Frequency: 7.0 times per week    Types: Cocaine    Comment: Pt stated "I do crack every day" and opiates  . Sexual activity: Not on file  Lifestyle  . Physical activity:    Days per week: Not on file  Minutes per session: Not on file  . Stress: Not on file  Relationships  . Social connections:    Talks on phone: Not on file    Gets together: Not on file    Attends religious service: Not on file    Active member of club or organization: Not on file    Attends meetings of clubs or organizations: Not on file    Relationship status: Not  on file  Other Topics Concern  . Not on file  Social History Narrative  . Not on file   Additional Social History:   Sleep: Improving  Appetite:  Fair  Current Medications: Current Facility-Administered Medications  Medication Dose Route Frequency Provider Last Rate Last Dose  . acetaminophen (TYLENOL) tablet 650 mg  650 mg Oral Q6H PRN Money, Lowry Ram, FNP      . albuterol (PROVENTIL HFA;VENTOLIN HFA) 108 (90 Base) MCG/ACT inhaler 2 puff  2 puff Inhalation Q6H PRN Ethelene Hal, NP   2 puff at 02/07/18 1429  . alum & mag hydroxide-simeth (MAALOX/MYLANTA) 200-200-20 MG/5ML suspension 30 mL  30 mL Oral Q4H PRN Money, Lowry Ram, FNP      . hydrOXYzine (ATARAX/VISTARIL) tablet 25 mg  25 mg Oral TID PRN Money, Lowry Ram, FNP   25 mg at 02/07/18 1428  . magnesium hydroxide (MILK OF MAGNESIA) suspension 30 mL  30 mL Oral Daily PRN Money, Darnelle Maffucci B, FNP      . nicotine (NICODERM CQ - dosed in mg/24 hours) patch 21 mg  21 mg Transdermal Daily Money, Lowry Ram, FNP   21 mg at 02/07/18 4967  . traZODone (DESYREL) tablet 50 mg  50 mg Oral QHS PRN Money, Lowry Ram, FNP   50 mg at 02/06/18 2236    Lab Results:  Results for orders placed or performed during the hospital encounter of 02/05/18 (from the past 48 hour(s))  TSH     Status: None   Collection Time: 02/06/18  6:40 AM  Result Value Ref Range   TSH 1.038 0.350 - 4.500 uIU/mL    Comment: Performed by a 3rd Generation assay with a functional sensitivity of <=0.01 uIU/mL. Performed at [facility], [place] [address] [place], [place] [address]     Blood Alcohol level:  Lab Results  Component Value Date   ETH <10 84/66/5993    Metabolic Disorder Labs: No results found for: HGBA1C, MPG No results found for: PROLACTIN No results found for: CHOL, TRIG, HDL, CHOLHDL, VLDL, LDLCALC  Physical Findings: AIMS:  , ,  ,  ,    CIWA:    COWS:     Musculoskeletal: Strength & Muscle Tone: within normal limits Gait &  Station: normal Patient leans: N/A  Psychiatric Specialty Exam: Physical Exam  ROS denies headache, denies chest pain, denies shortness of breath, no vomiting. At this time does not endorse significant symptoms of withdrawal, although chart notes indicate that he had reported chills and cravings earlier this a.m..   Blood pressure 114/79, pulse 71, temperature 97.9 F (36.6 C), temperature source Oral, resp. rate 18, height _0  (1.753 m), weight 77.6 kg, SpO2 100 %.Body mass index is 25.25 kg/m.  General Appearance: Fairly Groomed-  Eye Contact:  Fair-proved during session  Speech:  Normal Rate  Volume:  Decreased  Mood:  Anxious and Depressed  Affect:  Overall vaguely constricted, does exhibit inappropriate affect at times  Thought Process:  Linear and Descriptions of Associations: Tangential-tends to become tangential with open-ended  questions  Orientation:  Other:  Fully alert and attentive  Thought Content:  Endorses auditory hallucinations which he describes as intermittent, does not appear internally preoccupied at this time, no delusions are expressed  Suicidal Thoughts:  No currently denies suicidal or self-injurious ideations, contracts for safety on unit  Homicidal Thoughts:  No currently denies homicidal or violent ideations  Memory:  Recent and remote fair  Judgement:  Fair  Insight:  Fair  Psychomotor Activity:  Decreased-no current tremors, diaphoresis, or psychomotor agitation  Concentration:  Concentration: Fair and Attention Span: Fair  Recall:  [employer] of Knowledge:  Fair  Language:  Fair  Akathisia:  Negative  Handed:  Right  AIMS (if indicated):     Assets:  Desire for Improvement Resilience  ADL's:  Intact  Cognition:  WNL  Sleep:  Number of Hours: 6.75    Assessment : 33 year old male, separated, homeless, has been living at [facility]. Presented voluntarily due to worsening depression, neurovegetative symptoms, suicidal thoughts of overdosing.  Initially  denied psychotic symptoms but, as above, now endorses intermittent auditory hallucinations.  Reports history of abusing opiate analgesics, admission drug screen was positive for cocaine, cannabis, negative for opiates.  He reports a prior history of psychiatric admissions and chart notes indicate a prior diagnosis of schizoaffective disorder.  Today patient presents vaguely anxious, depressed, describes intermittent auditory hallucinations.  Denies suicidal ideations.  He at times chuckles/laughs briefly without any particular reason, consistent with episodes of inappropriate affect.  At this time he is not presenting with symptoms of opiate withdrawal, vitals are stable, but earlier had described cravings and subjective chills. Currently he is interested in starting psychiatric medications and states that in the past a combination of Risperidone and Remeron was effective. treatment Plan Summary: Daily contact with patient to assess and evaluate symptoms and progress in treatment, Medication management, Plan Inpatient treatment and Medications as below Encourage group and milieu participation to work on coping skills and symptom reduction Encourage efforts to work on sobriety and relapse prevention Start risperidone 1 mg twice daily for mood disorder/psychotic symptoms Start Remeron 7.5 mg nightly for depression, insomnia Discontinue Trazodone as being started on Remeron Continue Vistaril 25 mg 3 times daily as needed for anxiety as needed Check lipid panel and hemoglobin A1c Treatment team working on disposition planning options Jenne Campus, MD 02/07/2018, 4:01 PM

## 2018-02-07 NOTE — Plan of Care (Addendum)
Patient self inventory- Patient slept fair last night, sleep medication was requested. Appetite is fair, concentration poor, energy low. Depression, hopelessness, and anxiety rated all 5/10. Endorses withdrawal feelings such as tremors, cravings, chilling, and irritability. Denies SI HI AVH. Denies physical problems. Patient rates pain 8/10 in his back. Patient said it is important for him to work on "back pain." Patient is compliant with medications prescribed no side effects noted. Safety is maintained with 15 minute checks as well as environmental checks. Will continue to monitor and provide support.  Problem: Education: Goal: Knowledge of [place] [facility] Education information/materials will improve Outcome: Progressing Goal: Emotional status will improve Outcome: Progressing Goal: Mental status will improve Outcome: Progressing Goal: Verbalization of understanding the information provided will improve Outcome: Progressing   Problem: Education: Goal: Ability to make informed decisions regarding treatment will improve Outcome: Progressing

## 2018-02-07 NOTE — Progress Notes (Signed)
Attend group 

## 2018-02-07 NOTE — Tx Team (Signed)
Interdisciplinary Treatment and Diagnostic Plan Update  02/07/2018 Time of Session: [id number] Ta Micheal Walker MRN: [id number]  Principal Diagnosis: Schizoaffective disorder [facility])  Secondary Diagnoses: Principal Problem:   Schizoaffective disorder (HCC) Active Problems:   Cocaine use disorder, severe, dependence (HCC)   Opioid dependence with opioid-induced mood disorder (HCC)   Current Medications:  Current Facility-Administered Medications  Medication Dose Route Frequency Provider Last Rate Last Dose  . acetaminophen (TYLENOL) tablet 650 mg  650 mg Oral Q6H PRN Money, Gerlene Burdock, FNP      . albuterol (PROVENTIL HFA;VENTOLIN HFA) 108 (90 Base) MCG/ACT inhaler 2 puff  2 puff Inhalation Q6H PRN Laveda Abbe, NP   2 puff at 02/06/18 2123  . alum & mag hydroxide-simeth (MAALOX/MYLANTA) 200-200-20 MG/5ML suspension 30 mL  30 mL Oral Q4H PRN Money, Gerlene Burdock, FNP      . hydrOXYzine (ATARAX/VISTARIL) tablet 25 mg  25 mg Oral TID PRN Money, Gerlene Burdock, FNP   25 mg at 02/06/18 2329  . magnesium hydroxide (MILK OF MAGNESIA) suspension 30 mL  30 mL Oral Daily PRN Money, Micheal Beam B, FNP      . nicotine (NICODERM CQ - dosed in mg/24 hours) patch 21 mg  21 mg Transdermal Daily Money, Gerlene Burdock, FNP   21 mg at 02/07/18 9233  . traZODone (DESYREL) tablet 50 mg  50 mg Oral QHS PRN Money, Gerlene Burdock, FNP   50 mg at 02/06/18 2236   PTA Medications: Medications Prior to Admission  Medication Sig Dispense Refill Last Dose  . doxycycline (VIBRAMYCIN) 100 MG capsule Take 1 capsule (100 mg total) by mouth 2 (two) times daily. One po bid x 7 days (Patient not taking: Reported on 02/05/2018) 14 capsule 0 Completed Course at Unknown time    Patient Stressors: Financial difficulties Substance abuse Other: Homelessness   Patient Strengths: Average or above average intelligence Communication skills Motivation for treatment/growth  Treatment Modalities: Medication Management, Group therapy, Case management,  1  to 1 session with clinician, Psychoeducation, Recreational therapy.   Physician Treatment Plan for Primary Diagnosis: Schizoaffective disorder (HCC) Long Term Goal(s): Improvement in symptoms so as ready for discharge Improvement in symptoms so as ready for discharge   Short Term Goals: Ability to identify changes in lifestyle to reduce recurrence of condition will improve Ability to disclose and discuss suicidal ideas Ability to identify and develop effective coping behaviors will improve Compliance with prescribed medications will improve Ability to identify triggers associated with substance abuse/mental health issues will improve Ability to identify changes in lifestyle to reduce recurrence of condition will improve Ability to disclose and discuss suicidal ideas Ability to identify and develop effective coping behaviors will improve Compliance with prescribed medications will improve Ability to identify triggers associated with substance abuse/mental health issues will improve  Medication Management: Evaluate patient's response, side effects, and tolerance of medication regimen.  Therapeutic Interventions: 1 to 1 sessions, Unit Group sessions and Medication administration.  Evaluation of Outcomes: Progressing  Physician Treatment Plan for Secondary Diagnosis: Principal Problem:   Schizoaffective disorder (HCC) Active Problems:   Cocaine use disorder, severe, dependence (HCC)   Opioid dependence with opioid-induced mood disorder (HCC)  Long Term Goal(s): Improvement in symptoms so as ready for discharge Improvement in symptoms so as ready for discharge   Short Term Goals: Ability to identify changes in lifestyle to reduce recurrence of condition will improve Ability to disclose and discuss suicidal ideas Ability to identify and develop effective coping behaviors will improve Compliance with prescribed medications  will improve Ability to identify triggers associated with  substance abuse/mental health issues will improve Ability to identify changes in lifestyle to reduce recurrence of condition will improve Ability to disclose and discuss suicidal ideas Ability to identify and develop effective coping behaviors will improve Compliance with prescribed medications will improve Ability to identify triggers associated with substance abuse/mental health issues will improve     Medication Management: Evaluate patient's response, side effects, and tolerance of medication regimen.  Therapeutic Interventions: 1 to 1 sessions, Unit Group sessions and Medication administration.  Evaluation of Outcomes: Progressing   RN Treatment Plan for Primary Diagnosis: Schizoaffective disorder (HCC) Long Term Goal(s): Knowledge of disease and therapeutic regimen to maintain health will improve  Short Term Goals: Ability to remain free from injury will improve, Ability to verbalize frustration and anger appropriately will improve, Ability to demonstrate self-control and Ability to disclose and discuss suicidal ideas  Medication Management: RN will administer medications as ordered by provider, will assess and evaluate patient's response and provide education to patient for prescribed medication. RN will report any adverse and/or side effects to prescribing provider.  Therapeutic Interventions: 1 on 1 counseling sessions, Psychoeducation, Medication administration, Evaluate responses to treatment, Monitor vital signs and CBGs as ordered, Perform/monitor CIWA, COWS, AIMS and Fall Risk screenings as ordered, Perform wound care treatments as ordered.  Evaluation of Outcomes: Progressing   LCSW Treatment Plan for Primary Diagnosis: Schizoaffective disorder (HCC) Long Term Goal(s): Safe transition to appropriate next level of care at discharge, Engage patient in therapeutic group addressing interpersonal concerns.  Short Term Goals: Engage patient in aftercare planning with referrals  and resources, Facilitate patient progression through stages of change regarding substance use diagnoses and concerns and Identify triggers associated with mental health/substance abuse issues  Therapeutic Interventions: Assess for all discharge needs, 1 to 1 time with Social worker, Explore available resources and support systems, Assess for adequacy in community support network, Educate family and significant other(s) on suicide prevention, Complete Psychosocial Assessment, Interpersonal group therapy.  Evaluation of Outcomes: Progressing   Progress in Treatment: Attending groups: Yes. Participating in groups: Yes. Taking medication as prescribed: Yes. Toleration medication: Yes. Family/Significant other contact made: No, will contact:  pt's sister to complete SPE and for collateral contact. (with pt consent).  Patient understands diagnosis: Yes. Discussing patient identified problems/goals with staff: Yes. Medical problems stabilized or resolved: Yes. Denies suicidal/homicidal ideation: Yes. Issues/concerns per patient self-inventory: No. Other: n/a   New problem(s) identified: No, Describe:  n/a  New Short Term/Long Term Goal(s): detox, decrease/elimination of AVH, medication management for mood stabilization; elimination of SI thoughts; development of comprehensive mental wellness/sobriety plan.   Patient Goals:  "to get help for using crack and for my depression."   Discharge Plan or Barriers: CSW assessing for appropriate referrals. Pt is homeless and staying in a local shelter. [facility]). [facility] and [facility]. [facility] pamphlet, Mobile Crisis information, and AA/NA information provided to patient for additional community support and resources.   Reason for Continuation of Hospitalization: Anxiety Depression Hallucinations Medication stabilization Withdrawal  symptoms  Estimated Length of Stay: Wed, 02/09/2018  Attendees: Patient: Micheal FreezeDeshawn Walker 02/07/2018 10:18 AM  Physician: Dr. Jeannine KittenFarah MD 02/07/2018 10:18 AM  Nursing: Arlyss RepressAlyssa RN; Casimiro NeedleMichael RN 02/07/2018 10:18 AM  RN Care Manager:x 02/07/2018 10:18 AM  Social Worker: Corrie MckusickHeather Exavior Kimmons LCSW 02/07/2018 10:18 AM  Recreational Therapist: x 02/07/2018 10:18 AM  Other: Armandina StammerAgnes Nwoko NP; Marylu LundJanet  Sykes NP 02/07/2018 10:18 AM  Other:  02/07/2018 10:18 AM  Other: 02/07/2018 10:18 AM    Scribe for Treatment Team: Rona Ravens, LCSW 02/07/2018 10:18 AM

## 2018-02-07 NOTE — Progress Notes (Signed)
Recreation Therapy Notes  Date:  1.27.20 Time: 0930 Location: [address] Dayroom  Group Topic: Stress Management  Goal Area(s) Addresses:  Patient will identify positive stress management techniques. Patient will identify benefits of using stress management post d/c.  Behavioral Response:  Engaged  Intervention:  Stress Management  Activity :  Meditation.  LRT introduced stress management technique of meditation.  LRT played a meditation that focused on the possibilities the day could bring.  Patients were to follow along as meditation played in order to engage in meditation.  Education:  Stress Management, Discharge Planning.   Education Outcome: Acknowledges Education  Clinical Observations/Feedback: Pt attended and participated in activity.    Caroll Rancher, LRT/CTRS         Caroll Rancher A 02/07/2018 12:15 PM

## 2018-02-08 NOTE — Progress Notes (Signed)
1"1 Note 1100  Patient continues to sleep in his bed.  Respirations even and unlabored.  No signs/symptoms of pain/distress noted on patient's face/body movements.  1:1 continues per MD order for safety.
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
## 2018-02-08 NOTE — Progress Notes (Signed)
1:1 Note 1630  Patient woke up and walked down hallway with 1:1.  Was given ginger ale and crackers.  Patient has been sleeping most of the day.  Patient stated the medicine makes him sleep.  Respirations even and unlabored.  No signs/symptoms of pain/distress noted on patient's face/body movements.  1:1 continues for safety per MD.

## 2018-02-08 NOTE — Progress Notes (Signed)
1:1 Note 0840  Patient was started on 1:1 for safety.  Patient was on 300 hall last night and was moved to 400 hall.  Respirations even and unlabored.  No signs/symtoms of pain/distress noted on patient's face/body movements.  Patient has been laying in bed resting with eyes closed.

## 2018-02-08 NOTE — Progress Notes (Signed)
1:1 Note 0900  Patient continues to lay in bed with eyes closed.  Respirations even and unlabored.  No signs/symptoms of pain/distress noted on patient's face/body movements.  Financial assistant came into patient's room this morning and stated she would be glad to help him sign up for medicaid or other financial aid.  Patient declined her help and she left her business card for patient to call her if assistance needed.  Patient went back to sleep.  1:1 continues for safety per MD order.

## 2018-02-08 NOTE — Progress Notes (Signed)
1:1 Note 1345  Patient's self inventory sheet, patient has poor sleep, sleep medication helpful.  Good appetite, normal energy level, good concentration.  Rated depression and hopeless 6, anxiety 5.  Denied withdrawals.  Denied SI.  Denied physical problems.  Physical pain, back pain, worst pain #6, no pain medicine.  Goal is group, AA.  Plans to work hard.  "I would like to stay on my meds when I discharge."  No discharge plans. Will have "poor support, drugs" after discharge.  Patient continues to lay in his bed resting. 1:1 continues for safety.

## 2018-02-08 NOTE — Progress Notes (Signed)
1:1 Note 1800  Patient ate 100% of his dinner.  Has been to the bathroom once this afternoon.   Patient is laying in bed with eyes closed.  Respirations even and unlabored.  No signs/symptoms of pain/distress noted on patient's face/body movements.  1:1 continues for safety per MD order.

## 2018-02-08 NOTE — Progress Notes (Signed)
1:1 Note 1430  Patient continues to lay in bed with eyes closed.  Respirations even and unlabored.  No signs/symptoms of pain/distress noted on patient's face/body movements.  1:1 continues for safety per MD order.

## 2018-02-08 NOTE — Progress Notes (Signed)
1:1 Note 1900  Patient continues to sleep in his bed.   Respirations even and unlabored.  No signs/symptoms of pain/distress noted on patient's face/body movements.  1:1 continues per MD order.

## 2018-02-08 NOTE — Plan of Care (Signed)
Nurse discussed depression, anxiety, coping skills with patient.  
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
## 2018-02-08 NOTE — Progress Notes (Signed)
1:1 Note 1330  Patient ate 100% of his lunch.  Patient moved to 500 hall.  Patient has been to bathroom twice this morning.  Presently patient is laying in bed with eyes closed.  Respirations even and unlabored.  No signs/symptoms of pain/distress noted on patient's face/body movements.  1:1 continues for safety per MD order.

## 2018-02-09 MED ORDER — NICOTINE 21 MG/24HR TD PT24: 21.0000 mg | MEDICATED_PATCH | Freq: Every day | TRANSDERMAL | 0 refills | Status: AC

## 2018-02-09 MED ORDER — HYDROXYZINE HCL 25 MG PO TABS: 25.0000 mg | ORAL_TABLET | Freq: Three times a day (TID) | ORAL | 0 refills | Status: DC | PRN

## 2018-02-09 MED ORDER — RISPERIDONE 1 MG PO TBDP: 1.0000 mg | ORAL_TABLET | Freq: Two times a day (BID) | ORAL | 0 refills | Status: DC

## 2018-02-09 MED ORDER — ALBUTEROL SULFATE HFA 108 (90 BASE) MCG/ACT IN AERS: 2.0000 | INHALATION_SPRAY | Freq: Four times a day (QID) | RESPIRATORY_TRACT | 0 refills | Status: AC | PRN

## 2018-02-09 MED ORDER — MIRTAZAPINE 7.5 MG PO TABS: 7.5000 mg | ORAL_TABLET | Freq: Every day | ORAL | 0 refills | Status: DC

## 2018-02-09 NOTE — Progress Notes (Signed)
Patient ID: Micheal Walker, male   DOB: 05-23-1985, 33 y.o.   MRN: [id number] 1:1 Note Pt at this time remained in bed resting with eyes closed. Pt does not look to be in any distress at this time. 1:1 staff is present in room with Pt at this time. 1:1 monitoring continues for Pt's safety. 15-minute safety checks also continues at this time.

## 2018-02-09 NOTE — Progress Notes (Signed)
  [facility] Adult Case Management Discharge Plan :  Will you be returning to the same living situation after discharge:  Yes,  plans to return to shelter. declined resources At discharge, do you have transportation home?: Yes,  bus Do you have the ability to pay for your medications: Yes,  mental health  Release of information consent forms completed and submitted to medical records by CSW.   Patient to Follow up at: Follow-up Information    [facility] Follow up on 02/17/2018.   Why:  Screening for possible admission on Thursday, 2/6 at 7:45AM. Please bring: proof of [place] residence/photo ID, 30 day supply of all prescribed medications, and clothing. Thank you.  Contact information: [address] [place] [address] [phone]        [facility] Follow up on 02/16/2018.   Specialty:  Behavioral Health Why:  Hospital follow up appointment is 2/5 at 8:00a. Please bring your photo ID, proof of insurance, SSN, current medications, and discharge paperwork from this hospitalization.  Contact information: [address] [place] [address] [phone]           Next level of care provider has access to [facility] Link:no  Safety Planning and Suicide Prevention discussed: Yes,  SPE completed with pt; pt declined to consent to collateral contact. SPI pamphlet and mobile crisis information provided to pt.      Has patient been referred to the Quitline?: Patient refused referral  Patient has been referred for addiction treatment: Yes  Rona Ravens, LCSW 02/09/2018, 11:10 AM

## 2018-02-09 NOTE — Progress Notes (Signed)
Patient ID: Cyndy FreezeDeshawn Cuffee, male   DOB: 05/22/1985, 33 y.o.   MRN: [id number] 1:1 Note Pt at this time is in bed resting with eyes closed. Pt does not look to be in any distress at this time. 1:1 staff is present in room with Pt at this time. 1:1 monitoring continues for Pt's safety. 15-minute safety checks also continues at this time.

## 2018-02-09 NOTE — BHH Suicide Risk Assessment (Signed)
[facility] Discharge Suicide Risk Assessment   Principal Problem: Schizoaffective disorder [facility]) Discharge Diagnoses: Principal Problem:   Schizoaffective disorder (HCC) Active Problems:   Cocaine use disorder, severe, dependence (HCC)   Opioid dependence with opioid-induced mood disorder (HCC)   Total Time spent with patient: 15 minutes  Musculoskeletal: Strength & Muscle Tone: within normal limits Gait & Station: normal Patient leans: N/A  Psychiatric Specialty Exam: Review of Systems  All other systems reviewed and are negative.   Blood pressure 108/73, pulse 73, temperature (!) 97.5 F (36.4 C), temperature source Oral, resp. rate 16, height 5\' 9"  (1.753 m), weight 77.6 kg, SpO2 100 %.Body mass index is 25.25 kg/m.  General Appearance: Casual  Eye Contact::  Fair  Speech:  Normal Rate409  Volume:  Normal  Mood:  Anxious  Affect:  Congruent  Thought Process:  Coherent and Descriptions of Associations: Intact  Orientation:  Full (Time, Place, and Person)  Thought Content:  Logical  Suicidal Thoughts:  No  Homicidal Thoughts:  No  Memory:  Immediate;   Fair Recent;   Fair Remote;   Fair  Judgement:  Intact  Insight:  Lacking  Psychomotor Activity:  Normal  Concentration:  Fair  Recall:  [employer] of Knowledge:Fair  Language: Fair  Akathisia:  Negative  Handed:  Right  AIMS (if indicated):     Assets:  Desire for Improvement Leisure Time Physical Health Resilience  Sleep:  Number of Hours: 3.75  Cognition: WNL  ADL's:  Intact   Mental Status Per Nursing Assessment::   On Admission:  Suicidal ideation indicated by patient  Demographic Factors:  Male and Low socioeconomic status  Loss Factors: Legal issues and Financial problems/change in socioeconomic status  Historical Factors: Prior suicide attempts and Impulsivity  Risk Reduction Factors:   Living with another person, especially a relative and Positive social support  Continued Clinical Symptoms:   Depression:   Impulsivity Schizophrenia:   Less than 39 years old  Cognitive Features That Contribute To Risk:  None    Suicide Risk:  Minimal: No identifiable suicidal ideation.  Patients presenting with no risk factors but with morbid ruminations; may be classified as minimal risk based on the severity of the depressive symptoms  Follow-up Information    [facility] Follow up on 02/17/2018.   Why:  Screening for possible admission on Thursday, 2/6 at 7:45AM. Please bring: proof of [place] residence/photo ID, 30 day supply of all prescribed medications, and clothing. Thank you.  Contact information: [address] [place] [address] [phone]        [facility] Follow up on 02/16/2018.   Specialty:  Behavioral Health Why:  Hospital follow up appointment is 2/5 at 8:00a. Please bring your photo ID, proof of insurance, SSN, current medications, and discharge paperwork from this hospitalization.  Contact information: [address] [place] [address] [phone]           Plan Of Care/Follow-up recommendations:  Activity:  ad lib  Antonieta Pert, MD 02/09/2018, 7:38 AM

## 2018-02-09 NOTE — Progress Notes (Signed)
Recreation Therapy Notes  Date: 1.29.20 Time: 1000 Location: 500 Hall Dayroom  Group Topic: Goal Setting  Goal Area(s) Addresses:  Patient will be able to identify at least 3 goals.  Patient will be able to identify benefit of investing in goals.  Patient will be able to identify benefit of setting goals.   Intervention: Worksheet, pencils  Activity: Goal Planning.  Patients were to come up with goals they wanted to accomplish in a week, month, 1 year and 5 years.  Patients were to then identify any obstacles they may face while working towards their goals, what they need to be successful in reaching their goals and what they can start doing now to work towards their goals.  Education:  Discharge Planning, Pharmacologist, Leisure Education   Education Outcome: Acknowledges Education/In Group Clarification Provided/Needs Additional Education  Clinical Observations:  Pt did not attend group.     Caroll Rancher, LRT/CTRS         Caroll Rancher A 02/09/2018 12:16 PM

## 2018-02-09 NOTE — BHH Suicide Risk Assessment (Signed)
[facility] INPATIENT:  Family/Significant Other Suicide Prevention Education  Suicide Prevention Education:  Patient Refusal for Family/Significant Other Suicide Prevention Education: The patient Micheal Walker has refused to provide written consent for family/significant other to be provided Family/Significant Other Suicide Prevention Education during admission and/or prior to discharge.  Physician notified.  SPE completed with pt, as pt refused to consent to family contact. SPI pamphlet provided to pt and pt was encouraged to share information with support network, ask questions, and talk about any concerns relating to SPE. Pt denies access to guns/firearms and verbalized understanding of information provided. Mobile Crisis information also provided to pt.   Rona Ravens LCSW 02/09/2018, 11:10 AM

## 2018-02-09 NOTE — Progress Notes (Signed)
Patient ID: Micheal Walker, male   DOB: 1985/10/02, 33 y.o.   MRN: [id number] 1:1 Note  Pt at this time is in bed resting with eyes closed. Pt does not look to be in any distress at this time. 1:1 staff is present in room with Pt at this time. 1:1 monitoring continues for Pt's safety. Pt at assessment denied any anxiety, depression or SI. 15-minute safety checks continue at this time for Pt's safety.
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
## 2018-02-10 ENCOUNTER — Emergency Department (HOSPITAL_COMMUNITY)
Admission: EM | Admit: 2018-02-10 | Discharge: 2018-02-11 | Disposition: A | Discharge status: Home / Self Care | Payer: Self-pay | Attending: Emergency Medicine | Admitting: Emergency Medicine

## 2018-02-10 ENCOUNTER — Encounter (HOSPITAL_COMMUNITY): Payer: Self-pay

## 2018-02-10 DIAGNOSIS — T7621XA Adult sexual abuse, suspected, initial encounter: Secondary | ICD-10-CM | POA: Insufficient documentation

## 2018-02-10 DIAGNOSIS — Z79899 Other long term (current) drug therapy: Secondary | ICD-10-CM | POA: Insufficient documentation

## 2018-02-10 DIAGNOSIS — F17210 Nicotine dependence, cigarettes, uncomplicated: Secondary | ICD-10-CM | POA: Insufficient documentation

## 2018-02-10 HISTORY — DX: Bipolar disorder, unspecified: F31.9

## 2018-02-10 LAB — COMPREHENSIVE METABOLIC PANEL
ALT: 15 U/L (ref 0–44)
AST: 19 U/L (ref 15–41)
Albumin: 3.4 g/dL — ABNORMAL LOW (ref 3.5–5.0)
Alkaline Phosphatase: 61 U/L (ref 38–126)
Anion gap: 10 (ref 5–15)
BILIRUBIN TOTAL: 0.5 mg/dL (ref 0.3–1.2)
BUN: 7 mg/dL (ref 6–20)
CO2: 25 mmol/L (ref 22–32)
Calcium: 8.9 mg/dL (ref 8.9–10.3)
Chloride: 101 mmol/L (ref 98–111)
Creatinine, Ser: 0.96 mg/dL (ref 0.61–1.24)
GFR calc Af Amer: >60 mL/min (ref >60)
Glucose, Bld: 106 mg/dL — ABNORMAL HIGH (ref 70–99)
Potassium: 3.3 mmol/L — ABNORMAL LOW (ref 3.5–5.1)
Sodium: 136 mmol/L (ref 135–145)
Total Protein: 6.4 g/dL — ABNORMAL LOW (ref 6.5–8.1)

## 2018-02-10 MED ORDER — AZITHROMYCIN 250 MG PO TABS
1000.0000 mg | ORAL_TABLET | Freq: Once | ORAL | Status: AC
  Administered 2018-02-10: 1000 mg via ORAL
  Filled 2018-02-10: qty 4

## 2018-02-10 MED ORDER — ELVITEG-COBIC-EMTRICIT-TENOFAF 150-150-200-10 MG PREPACK
5.0000 | ORAL_TABLET | Freq: Once | ORAL | Status: AC
  Administered 2018-02-11: 5 via ORAL
  Filled 2018-02-10: qty 1
  Filled 2018-02-10 (×2): qty 5

## 2018-02-10 MED ORDER — METRONIDAZOLE 500 MG PO TABS
2000.0000 mg | ORAL_TABLET | Freq: Once | ORAL | Status: AC
  Administered 2018-02-10: 2000 mg via ORAL
  Filled 2018-02-10: qty 4

## 2018-02-10 MED ORDER — GENTAMICIN SULFATE 40 MG/ML IJ SOLN
240.0000 mg | Freq: Once | INTRAMUSCULAR | Status: AC
  Administered 2018-02-11: 240 mg via INTRAMUSCULAR
  Filled 2018-02-10: qty 6

## 2018-02-10 MED ORDER — ELVITEG-COBIC-EMTRICIT-TENOFAF 150-150-200-10 MG PO TABS: 1.0000 | ORAL_TABLET | Freq: Every day | ORAL | 0 refills | Status: AC

## 2018-02-10 MED ORDER — ELVITEG-COBIC-EMTRICIT-TENOFAF 150-150-200-10 MG PO TABS: 1.0000 | ORAL_TABLET | Freq: Every day | ORAL | 0 refills | Status: DC

## 2018-02-10 NOTE — ED Notes (Signed)
Pt given turkey sandwich and gingerale. 

## 2018-02-10 NOTE — ED Notes (Signed)
SANE nurse at bedside.
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
## 2018-02-10 NOTE — Discharge Instructions (Signed)
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 a summary. It may not cover all possible information. If you have questions about this medicine, talk to your doctor, pharmacist, or health care provider.  2018 Elsevier/Gold Standard (2015-10-14 12:54:04)

## 2018-02-10 NOTE — SANE Note (Signed)
Follow-up Phone Call  Patient gives verbal consent for a FNE/SANE follow-up phone call in 48-72 hours: DID NOT ASK THE PT. Patient's telephone number: [phone] (W/ VOICEMAIL AND TEXTING) Patient gives verbal consent to leave voicemail at the phone number listed above: DID NOT ASK THE PT. DO NOT CALL between the hours of: N/A
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
## 2018-02-11 LAB — RAPID HIV SCREEN (HIV 1/2 AB+AG)
HIV 1/2 Antibodies: NONREACTIVE
HIV-1 P24 Antigen - HIV24: NONREACTIVE

## 2018-02-11 MED FILL — GENVOYA TABLET: 150-150-200 | 30 days supply | Qty: 30 | Fill #0
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
## 2018-02-11 NOTE — SANE Note (Signed)
On 02/11/2018, at approximately 0025 hours, the SANE/FNE Teacher, music(Forensic Nurse Examiner) consult has been completed. The primary RN and physician have been notified. Please contact the SANE/FNE nurse on call (listed in Amion) with any further concerns.

## 2018-02-11 NOTE — SANE Note (Signed)
ON 02/10/2018, AT APPROXIMATELY 2347 HOURS, THE FOLLOWING VOUCHER NUMBERS FOR HIV nPEP (GENVOYA) WERE FAXED TO THE [facility] ([facility]):  BIN#:  [id number]  PCN:  [id number]  GROUP:  [id number]  MEMBER #:  [id number]  THE ABOVE VOUCHER NUMBERS WERE ALSO EMAILED TO THE [facility], AT APPROXIMATELY 0027 HOURS ON 02/11/2018.

## 2018-02-12 LAB — HEPATITIS B SURFACE ANTIGEN: Hepatitis B Surface Ag: NEGATIVE

## 2018-02-14 LAB — HEPATITIS C ANTIBODY: HCV Ab: <0.1 s/co ratio (ref 0.0–0.9)

## 2018-02-14 LAB — RPR: RPR Ser Ql: NONREACTIVE

## 2018-02-21 ENCOUNTER — Other Ambulatory Visit: Payer: Self-pay

## 2018-02-21 ENCOUNTER — Emergency Department (HOSPITAL_COMMUNITY)
Admission: EM | Admit: 2018-02-21 | Discharge: 2018-02-22 | Disposition: A | Discharge status: Home / Self Care | Payer: Self-pay | Attending: Emergency Medicine | Admitting: Emergency Medicine

## 2018-02-21 DIAGNOSIS — R45851 Suicidal ideations: Secondary | ICD-10-CM | POA: Insufficient documentation

## 2018-02-21 DIAGNOSIS — Z79899 Other long term (current) drug therapy: Secondary | ICD-10-CM | POA: Insufficient documentation

## 2018-02-21 DIAGNOSIS — Z046 Encounter for general psychiatric examination, requested by authority: Secondary | ICD-10-CM | POA: Insufficient documentation

## 2018-02-21 DIAGNOSIS — R0602 Shortness of breath: Secondary | ICD-10-CM | POA: Insufficient documentation

## 2018-02-21 DIAGNOSIS — F332 Major depressive disorder, recurrent severe without psychotic features: Secondary | ICD-10-CM | POA: Insufficient documentation

## 2018-02-21 DIAGNOSIS — R05 Cough: Secondary | ICD-10-CM | POA: Insufficient documentation

## 2018-02-21 DIAGNOSIS — F17200 Nicotine dependence, unspecified, uncomplicated: Secondary | ICD-10-CM | POA: Insufficient documentation

## 2018-02-21 DIAGNOSIS — R509 Fever, unspecified: Secondary | ICD-10-CM | POA: Insufficient documentation

## 2018-02-21 DIAGNOSIS — R61 Generalized hyperhidrosis: Secondary | ICD-10-CM | POA: Insufficient documentation

## 2018-02-21 DIAGNOSIS — F1910 Other psychoactive substance abuse, uncomplicated: Secondary | ICD-10-CM | POA: Insufficient documentation

## 2018-02-21 LAB — CBC
HEMATOCRIT: 45.5 % (ref 39.0–52.0)
Hemoglobin: 14.3 g/dL (ref 13.0–17.0)
MCH: 29.6 pg (ref 26.0–34.0)
MCHC: 31.4 g/dL (ref 30.0–36.0)
MCV: 94.2 fL (ref 80.0–100.0)
Platelets: 134 10*3/uL — ABNORMAL LOW (ref 150–400)
RBC: 4.83 MIL/uL (ref 4.22–5.81)
RDW: 13.2 % (ref 11.5–15.5)
WBC: 5.5 10*3/uL (ref 4.0–10.5)
nRBC: 0 % (ref 0.0–0.2)

## 2018-02-21 LAB — COMPREHENSIVE METABOLIC PANEL
ALT: 15 U/L (ref 0–44)
AST: 23 U/L (ref 15–41)
Albumin: 3.9 g/dL (ref 3.5–5.0)
Alkaline Phosphatase: 62 U/L (ref 38–126)
Anion gap: 9 (ref 5–15)
BILIRUBIN TOTAL: 0.6 mg/dL (ref 0.3–1.2)
BUN: <5 mg/dL — ABNORMAL LOW (ref 6–20)
CO2: 24 mmol/L (ref 22–32)
Calcium: 9.5 mg/dL (ref 8.9–10.3)
Chloride: 104 mmol/L (ref 98–111)
Creatinine, Ser: 1.07 mg/dL (ref 0.61–1.24)
GFR calc Af Amer: >60 mL/min (ref >60)
GFR calc non Af Amer: >60 mL/min (ref >60)
Glucose, Bld: 88 mg/dL (ref 70–99)
Potassium: 4.1 mmol/L (ref 3.5–5.1)
SODIUM: 137 mmol/L (ref 135–145)
Total Protein: 7.3 g/dL (ref 6.5–8.1)

## 2018-02-21 LAB — ACETAMINOPHEN LEVEL: Acetaminophen (Tylenol), Serum: <10 ug/mL — ABNORMAL LOW (ref 10–30)

## 2018-02-21 LAB — ETHANOL: Alcohol, Ethyl (B): <10 mg/dL (ref <10)

## 2018-02-21 LAB — SALICYLATE LEVEL: Salicylate Lvl: <7 mg/dL (ref 2.8–30.0)

## 2018-02-21 NOTE — ED Triage Notes (Signed)
Pt reports he used heroin today. Reports he started "feeling weird" and suicidal after using. Pt reports productive cough with yellow-brown sputum x 3 days, chills, pt febrile in triage(101.2), and SHOB. Pt reports he uses heroin and cocaine daily.

## 2018-02-22 ENCOUNTER — Encounter (HOSPITAL_COMMUNITY): Payer: Self-pay | Admitting: Registered Nurse

## 2018-02-22 ENCOUNTER — Emergency Department (HOSPITAL_COMMUNITY): Payer: Self-pay

## 2018-02-22 LAB — RAPID URINE DRUG SCREEN, HOSP PERFORMED
AMPHETAMINES: NOT DETECTED
Barbiturates: NOT DETECTED
Benzodiazepines: NOT DETECTED
Cocaine: POSITIVE — AB
Opiates: NOT DETECTED
Tetrahydrocannabinol: POSITIVE — AB

## 2018-02-22 LAB — INFLUENZA PANEL BY PCR (TYPE A & B)
Influenza A By PCR: NEGATIVE
Influenza B By PCR: NEGATIVE

## 2018-02-22 MED ORDER — NICOTINE 14 MG/24HR TD PT24: 14.0000 mg | MEDICATED_PATCH | Freq: Every day | TRANSDERMAL | Status: DC

## 2018-02-22 MED ORDER — ZOLPIDEM TARTRATE 5 MG PO TABS
5.0000 mg | ORAL_TABLET | Freq: Every evening | ORAL | Status: DC | PRN
  Administered 2018-02-22: 5 mg via ORAL
  Filled 2018-02-22: qty 1

## 2018-02-22 MED ORDER — ALUM & MAG HYDROXIDE-SIMETH 200-200-20 MG/5ML PO SUSP: 30.0000 mL | Freq: Four times a day (QID) | ORAL | Status: DC | PRN

## 2018-02-22 MED ORDER — HYDROXYZINE HCL 25 MG PO TABS
25.0000 mg | ORAL_TABLET | Freq: Three times a day (TID) | ORAL | Status: DC | PRN
  Administered 2018-02-22: 25 mg via ORAL
  Filled 2018-02-22: qty 1

## 2018-02-22 MED ORDER — ELVITEG-COBIC-EMTRICIT-TENOFAF 150-150-200-10 MG PO TABS
1.0000 | ORAL_TABLET | Freq: Every day | ORAL | Status: DC
  Administered 2018-02-22: 1 via ORAL
  Filled 2018-02-22 (×2): qty 1

## 2018-02-22 MED ORDER — RISPERIDONE 1 MG PO TBDP
1.0000 mg | ORAL_TABLET | Freq: Two times a day (BID) | ORAL | Status: DC
  Administered 2018-02-22 (×2): 1 mg via ORAL
  Filled 2018-02-22 (×3): qty 1

## 2018-02-22 MED ORDER — MIRTAZAPINE 15 MG PO TABS
7.5000 mg | ORAL_TABLET | Freq: Every day | ORAL | Status: DC
  Filled 2018-02-22: qty 1

## 2018-02-22 MED ORDER — ONDANSETRON HCL 4 MG PO TABS: 4.0000 mg | ORAL_TABLET | Freq: Three times a day (TID) | ORAL | Status: DC | PRN

## 2018-02-22 MED ORDER — ALBUTEROL SULFATE HFA 108 (90 BASE) MCG/ACT IN AERS: 2.0000 | INHALATION_SPRAY | Freq: Four times a day (QID) | RESPIRATORY_TRACT | Status: DC | PRN

## 2018-02-22 MED ORDER — ACETAMINOPHEN 325 MG PO TABS
650.0000 mg | ORAL_TABLET | ORAL | Status: DC | PRN
  Administered 2018-02-22: 650 mg via ORAL
  Filled 2018-02-22: qty 2

## 2018-02-22 NOTE — ED Notes (Signed)
TTS in process 

## 2018-02-22 NOTE — ED Notes (Signed)
Patient transported to X-ray 
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
## 2018-02-22 NOTE — Consult Note (Signed)
  Tele Assessment   Micheal Walker, 33 y.o., male patient presented to [facility] with complaints of suicidal ideation after relapsing on heroin.  Patient seen via telepsych by this provider; chart reviewed and consulted with Dr. Lucianne Muss on 02/22/18.  On evaluation Micheal Walker reports that the suicidal thoughts started after heroin and opiates.  States he started to feel sad then suicidal.  Patient states that he has tried to kill himself in the past "2 months ago" and was admitted to psychiatric hospital.  States that he goes to [facility] for outpatient services and that he is compliant with his medications.  Patient states that he does not want to stop doing drugs and is not interested in going to rehab.  States that he does heroin or opiates every other day.  States that he "boost; stealing stuff from [employer]" to pay for his drugs.  Patient states that he is unemployed and is not looking for work.  Patient asked what he needed or what we could do for him to help him stop feeling sad; since he refuses to stop doing the drugs which is making his depression worse.  "I don't know."  Patient reported earlier in his admission to ED that he was living with his mother; but reported to this provider that he was living at the [facility].  Patient denies active suicidal thoughts with intent and plan; just states that he feels sad; and has suicidal thought on and off and tired of feeling that way.  Patient denies homicidal ideation, psychosis, and paranoia.  Patient is not interested in any type of therapy, group sessions, or rehab.  States he was in [place] for 1 year 4 yrs ago and didn't help started getting hight the day he got out.  Patient has not given urine for UDS; ETOH negative.  Patient gave permission to speak with aunt Micheal Walker [phone]   During evaluation Micheal Walker is sitting up in bed; he is alert/oriented x 4; calm/cooperative; and mood congruent with affect.  Patient is speaking in a clear tone at  moderate volume, and normal pace; with good eye contact.  His thought process is coherent and relevant; There is no indication that he is currently responding to internal/external stimuli or experiencing delusional thought content.  Patient denies suicidal/self-harm/homicidal ideation, psychosis, and paranoia.  Patient has remained calm throughout assessment and has answered questions appropriately.  Patient encouraged to participate in substance use program; or rehab services.   Waiting on collateral from patient's aunt.  Patient does not meet inpatient psychiatric services   Recommendations:  Outpatient psychiatric services.    Disposition: Psychiatrically clear after collateral information from aunt if no problems with patient coming home   Spoke with Dr. Anitra Lauth; informed of above recommendation and disposition   Assunta Found, NP

## 2018-02-22 NOTE — BH Assessment (Signed)
Tele Assessment Note   Patient Name: Micheal Walker MRN: [id number] Referring Physician: Dr. Dione Boozeavid Glick Location of Patient: [facility] Location of Provider: Behavioral Health TTS Department  Micheal Walker is an 33 y.o. male.  -Clinician reviewed note by Dr. Preston FleetingGlick.  He has history of bipolar disorder, schizoaffective disorder, polysubstance abuse and comes in because of suicidal ideation.  He states that he had injected some heroin tonight, and felt worse following that.  He states he has been feeling unusually sad for the last 2weeks with associated crying spells, early morning awakening, anhedonia.  Tonight he had suicidal thoughts of jumping off of a building.  He denies hallucinations.  Patient is drowsy at on-set of assessment.  [facility]  Patient does report suicidal thoughts with plan to jump from a bridge.  Patient says he used some heroin and crack this evening.  He says that after the crack use he started feeling overwhelmingly depressed and suicidal.  Patient says that he has had three previous suicide attempts.  Pt denies any HI or current A/V hallucinations.  Patient complains of insomnia.  He promptly falls asleep after this.  He does not participate in the rest of the assessment.  Patient had a flat affect and poor eye contact.  [facility]  Patient has no known outpatient services.  -Clinician reviewed patient care with Nira ConnJason Berry, FNP.  He recommends observing patient overnight and having psychiatry review him in the AM.  Clinician informed Dr. Preston FleetingGlick of disposition.  Diagnosis: F25.1 Schizoaffective d/o depressive type; Cocaine use d/o; opioid use d/o  Past Medical History:  Past Medical History:  Diagnosis Date  . Bipolar 1 disorder (HCC)     No past surgical history on file.  Family History: No family history on file.  Social History:  reports that he has been smoking. He has been smoking about 0.50 packs per day. He  has never used smokeless tobacco. He reports current alcohol use. He reports current drug use. Frequency: 7.00 times per week. Drug: Cocaine.  Additional Social History:  Alcohol / Drug Use Pain Medications: See PTA medication list Prescriptions: See PTA medication list Over the Counter: See PTA medication list History of alcohol / drug use?: Yes Substance #1 Name of Substance 1: Heroin 1 - Age of First Use: unknown 1 - Amount (size/oz): took 50cc tonight IV 1 - Frequency: unknown 1 - Duration: on and off 1 - Last Use / Amount: 02/10 Reports 50 ccs of heroin Substance #2 Name of Substance 2: Crack 2 - Age of First Use: Teens 2 - Amount (size/oz): Varies 2 - Frequency: Unknown 2 - Duration: off and on 2 - Last Use / Amount: 02/10  CIWA: CIWA-Ar BP: 122/74 Pulse Rate: 89 COWS:    Allergies:  Allergies  Allergen Reactions  . Penicillin G Anaphylaxis    DID THE REACTION INVOLVE: Swelling of the face/tongue/throat, SOB, or low BP? Yes Sudden or severe rash/hives, skin peeling, or the inside of the mouth or nose? Yes Did it require medical treatment? Yes When did it last happen?05/2017 If all above answers are "NO", may proceed with cephalosporin use.    Home Medications: (Not in a hospital admission)   OB/GYN Status:  No LMP for male patient.  General Assessment Data [facility] Assessment: In system Is this a Tele or Face-to-Face Assessment?: Tele Assessment Is this an Initial Assessment or a Re-assessment for this encounter?:  Initial Assessment Patient Accompanied by:: N/A Language Other than English: No Living Arrangements: Homeless/Shelter What gender do you identify as?: Male Marital status: Married Pregnancy Status: No Living Arrangements: Other (Comment) Can pt return to current living arrangement?: Yes Admission Status: Voluntary Is patient capable of signing voluntary admission?: Yes Referral Source: Self/Family/Friend(Pt took  the bus to [facility].) Insurance type: self pay     Crisis Care Plan Living Arrangements: Other (Comment) Name of Psychiatrist: None Name of Therapist: None  Education Status Is patient currently in school?: No Highest grade of school patient has completed: some college. Is the patient employed, unemployed or receiving disability?: Unemployed  Risk to self with the past 6 months Suicidal Ideation: Yes-Currently Present Has patient been a risk to self within the past 6 months prior to admission? : Yes Suicidal Intent: Yes-Currently Present Has patient had any suicidal intent within the past 6 months prior to admission? : Yes Is patient at risk for suicide?: Yes Suicidal Plan?: Yes-Currently Present Has patient had any suicidal plan within the past 6 months prior to admission? : Yes Specify Current Suicidal Plan: Jump from a bridge. Access to Means: Yes Specify Access to Suicidal Means: Traffic, roadways, bridges What has been your use of drugs/alcohol within the last 12 months?: Crack, heroin Previous Attempts/Gestures: Yes How many times?: 3 Other Self Harm Risks: SA issues Triggers for Past Attempts: Other personal contacts Intentional Self Injurious Behavior: None Family Suicide History: No Recent stressful life event(s): Financial Problems, Other (Comment)(Pt is homesless) Persecutory voices/beliefs?: Yes Depression: Yes Depression Symptoms: Despondent, Isolating, Guilt, Loss of interest in usual pleasures, Feeling worthless/self pity Substance abuse history and/or treatment for substance abuse?: Yes Suicide prevention information given to non-admitted patients: Not applicable  Risk to Others within the past 6 months Homicidal Ideation: No Does patient have any lifetime risk of violence toward others beyond the six months prior to admission? : No Thoughts of Harm to Others: No Current Homicidal Intent: No Current Homicidal Plan: No Access to Homicidal Means: No Identified  Victim: No one History of harm to others?: No Assessment of Violence: None Noted Violent Behavior Description: Pt denies Does patient have access to weapons?: No Criminal Charges Pending?: No Does patient have a court date: No Is patient on probation?: No  Psychosis Hallucinations: None noted Delusions: None noted  Mental Status Report Appearance/Hygiene: Unremarkable, In scrubs Eye Contact: Poor Motor Activity: Freedom of movement, Unremarkable Speech: Logical/coherent Level of Consciousness: Sleeping, Drowsy Mood: Depressed, Sad Affect: Sad Anxiety Level: None Thought Processes: Coherent, Relevant Judgement: Impaired Orientation: Person, Place, Time, Situation Obsessive Compulsive Thoughts/Behaviors: None  Cognitive Functioning Concentration: Decreased Memory: Recent Impaired, Remote Intact Is patient IDD: No Insight: Poor Impulse Control: Poor Appetite: Fair Have you had any weight changes? : No Change Sleep: Decreased Total Hours of Sleep: (<4H/D) Vegetative Symptoms: None  ADLScreening [facility] Assessment Services) Patient's cognitive ability adequate to safely complete daily activities?: Yes Patient able to express need for assistance with ADLs?: Yes Independently performs ADLs?: Yes (appropriate for developmental age)  Prior Inpatient Therapy Prior Inpatient Therapy: Yes Prior Therapy Dates: 02/05/18 Prior Therapy Facilty/Provider(s): [facility] Reason for Treatment: SI, SA  Prior Outpatient Therapy Prior Outpatient Therapy: Yes Prior Therapy Dates: 2019 Prior Therapy Facilty/Provider(s): [facility] Reason for Treatment: Schizoaffective disorder Does patient have an ACCT team?: No Does patient have Intensive In-House Services?  : No Does patient have [facility] services? : No Does patient have P4CC services?: No  ADL Screening (condition at time of admission) Patient's cognitive ability  adequate to safely complete daily activities?: Yes Is the patient deaf or have  difficulty hearing?: No Does the patient have difficulty seeing, even when wearing glasses/contacts?: No Does the patient have difficulty concentrating, remembering, or making decisions?: No Patient able to express need for assistance with ADLs?: Yes Does the patient have difficulty dressing or bathing?: No Independently performs ADLs?: Yes (appropriate for developmental age) Does the patient have difficulty walking or climbing stairs?: No Weakness of Legs: None Weakness of Arms/Hands: None       Abuse/Neglect Assessment (Assessment to be complete while patient is alone) Physical Abuse: Denies Verbal Abuse: Yes, past (Comment)(Pt says he has some verbal abuse.) Sexual Abuse: Denies Exploitation of patient/patient's resources: Denies Self-Neglect: Denies     Merchant navy officer (For Healthcare) Does Patient Have a Medical Advance Directive?: No Would patient like information on creating a medical advance directive?: No - Patient declined          Disposition:  Disposition Initial Assessment Completed for this Encounter: Yes Patient referred to: Other (Comment)(Pt to be reviewed with FNP)  This service was provided via telemedicine using a 2-way, interactive audio and video technology.  Names of all persons participating in this telemedicine service and their role in this encounter. Name: Micheal Walker Role: patient  Name: Beatriz Stallion, M.S. LCAS QP Role: clinician  Name:  Role:   Name:  Role:     Alexandria Lodge 02/22/2018 4:03 AM

## 2018-02-22 NOTE — ED Notes (Addendum)
TTS Completed. Will continue overnight observation and reevaluate in the morning.

## 2018-02-22 NOTE — ED Notes (Signed)
Patient belongings never inventoried prior to arrival to [facility]; belongings collected from [facility] zone RN station was returned to patient at d/c-Monique,RN

## 2018-02-22 NOTE — ED Notes (Signed)
Spoke with Sam at [facility] Regarding the hold up on d/c patient; per Sam patient is psy cleared and no call has been returned from [facility] since 1320 this after noon; EDP notified to d/c patient at this time-Monique,RN

## 2018-02-22 NOTE — ED Notes (Signed)
Patient refused 2nd set of blood cultures

## 2018-02-22 NOTE — [facility] Counselor (Signed)
  Family Collateral  [facility] attempted to collect a family collateral from the pt's aunt, Kenard Boeder [phone]) but did not get a response.   TTS will attempt to call at a later time.  Jamilyn Pigeon L. Kapri Nero, MS, [facility], [facility] Therapeutic Triage Specialist  [phone]

## 2018-02-22 NOTE — ED Notes (Signed)
Dinner tray delivered by Scientist, clinical (histocompatibility and immunogenetics).  Pt remains asleep in no acute distress

## 2018-02-22 NOTE — ED Notes (Signed)
Sitter at bedside.

## 2018-02-24 ENCOUNTER — Encounter (HOSPITAL_COMMUNITY): Payer: Self-pay

## 2018-02-24 ENCOUNTER — Other Ambulatory Visit: Payer: Self-pay

## 2018-02-24 ENCOUNTER — Emergency Department (HOSPITAL_COMMUNITY): Payer: Self-pay

## 2018-02-24 ENCOUNTER — Emergency Department (HOSPITAL_COMMUNITY)
Admission: EM | Admit: 2018-02-24 | Discharge: 2018-02-25 | Disposition: A | Discharge status: Home / Self Care | Payer: Self-pay | Attending: Emergency Medicine | Admitting: Emergency Medicine

## 2018-02-24 DIAGNOSIS — R079 Chest pain, unspecified: Secondary | ICD-10-CM | POA: Insufficient documentation

## 2018-02-24 DIAGNOSIS — F17210 Nicotine dependence, cigarettes, uncomplicated: Secondary | ICD-10-CM | POA: Insufficient documentation

## 2018-02-24 DIAGNOSIS — R0602 Shortness of breath: Secondary | ICD-10-CM | POA: Insufficient documentation

## 2018-02-24 DIAGNOSIS — Z79899 Other long term (current) drug therapy: Secondary | ICD-10-CM | POA: Insufficient documentation

## 2018-02-24 LAB — BASIC METABOLIC PANEL
Anion gap: 14 (ref 5–15)
BUN: 7 mg/dL (ref 6–20)
CO2: 22 mmol/L (ref 22–32)
Calcium: 9.1 mg/dL (ref 8.9–10.3)
Chloride: 100 mmol/L (ref 98–111)
Creatinine, Ser: 0.92 mg/dL (ref 0.61–1.24)
GFR calc Af Amer: >60 mL/min (ref >60)
GFR calc non Af Amer: >60 mL/min (ref >60)
GLUCOSE: 82 mg/dL (ref 70–99)
Potassium: 4.1 mmol/L (ref 3.5–5.1)
Sodium: 136 mmol/L (ref 135–145)

## 2018-02-24 LAB — CBC
HCT: 42 % (ref 39.0–52.0)
Hemoglobin: 13.3 g/dL (ref 13.0–17.0)
MCH: 29.8 pg (ref 26.0–34.0)
MCHC: 31.7 g/dL (ref 30.0–36.0)
MCV: 94.2 fL (ref 80.0–100.0)
Platelets: 134 10*3/uL — ABNORMAL LOW (ref 150–400)
RBC: 4.46 MIL/uL (ref 4.22–5.81)
RDW: 12.8 % (ref 11.5–15.5)
WBC: 5.9 10*3/uL (ref 4.0–10.5)
nRBC: 0 % (ref 0.0–0.2)

## 2018-02-24 LAB — MAGNESIUM: Magnesium: 2.2 mg/dL (ref 1.7–2.4)

## 2018-02-24 MED ORDER — ALBUTEROL SULFATE HFA 108 (90 BASE) MCG/ACT IN AERS
1.0000 | INHALATION_SPRAY | Freq: Once | RESPIRATORY_TRACT | Status: AC
  Administered 2018-02-25: 1 via RESPIRATORY_TRACT
  Filled 2018-02-24: qty 6.7

## 2018-02-24 MED ORDER — IPRATROPIUM-ALBUTEROL 0.5-2.5 (3) MG/3ML IN SOLN
3.0000 mL | RESPIRATORY_TRACT | Status: AC
  Administered 2018-02-24 (×3): 3 mL via RESPIRATORY_TRACT
  Filled 2018-02-24: qty 6
  Filled 2018-02-24: qty 3

## 2018-02-24 MED ORDER — PREDNISONE 20 MG PO TABS: 20.0000 mg | ORAL_TABLET | Freq: Every day | ORAL | 0 refills | Status: AC

## 2018-02-24 MED ORDER — PREDNISONE 20 MG PO TABS
60.0000 mg | ORAL_TABLET | Freq: Once | ORAL | Status: AC
  Administered 2018-02-24: 60 mg via ORAL
  Filled 2018-02-24: qty 3

## 2018-02-24 NOTE — ED Triage Notes (Signed)
Pt arrives POV for eval of CP, SOB and cold sx x 5 days. Pt recently evaluated here on 2/10. Pt is coughing in triage, rhinorrhea. NARD.

## 2018-02-24 NOTE — ED Provider Notes (Signed)
[facility] EMERGENCY DEPARTMENT Provider Note   CSN: [id number] Arrival date & time: 02/24/18  1752  History   Chief Complaint Chief Complaint  Patient presents with  . Chest Pain  . Flu Like Symptoms   HPI Micheal Walker is a 33 y.o. male with PMH bipolar disorder presenting with productive cough with associated chest pain and SOB for the past week. He states his cough has had green sputum, he has felt feverish, and had chills. He uses an inhaler "because he smokes" but has not had his inhaler recently. He states his chest pain occurs when he is coughing. He denies other recent illness or sick contacts. He denies nausea, vomiting, changes in urination or BM. He denies sinus pain or sore throat. He smokes 1/2 per day since age 53. He denies alcohol or drug use.    HPI  Past Medical History:  Diagnosis Date  . Bipolar 1 disorder [facility])     Patient Active Problem List   Diagnosis Date Noted  . Cocaine use disorder, severe, dependence (HCC) 02/06/2018  . Opioid dependence with opioid-induced mood disorder (HCC)   . Schizoaffective disorder (HCC) 02/05/2018    History reviewed. No pertinent surgical history.    Home Medications    Prior to Admission medications   Medication Sig Start Date End Date Taking? Authorizing Provider  albuterol (PROVENTIL HFA;VENTOLIN HFA) 108 (90 Base) MCG/ACT inhaler Inhale 2 puffs into the lungs every 6 (six) hours as needed for wheezing or shortness of breath. 02/09/18   Aldean Baker, NP  elvitegravir-cobicistat-emtricitabine-tenofovir (GENVOYA) 150-150-200-10 MG TABS tablet Take 1 tablet by mouth daily with breakfast. 02/10/18   Linwood Dibbles, MD  hydrOXYzine (ATARAX/VISTARIL) 25 MG tablet Take 1 tablet (25 mg total) by mouth 3 (three) times daily as needed for anxiety. 02/09/18   Aldean Baker, NP  mirtazapine (REMERON) 7.5 MG tablet Take 1 tablet (7.5 mg total) by mouth at bedtime. For mood 02/09/18   Aldean Baker, NP  nicotine  (NICODERM CQ - DOSED IN MG/24 HOURS) 21 mg/24hr patch Place 1 patch (21 mg total) onto the skin daily. (May buy over the counter) 02/09/18   Aldean Baker, NP  risperiDONE (RISPERDAL M-TABS) 1 MG disintegrating tablet Take 1 tablet (1 mg total) by mouth 2 (two) times daily. For mood/hallucinations 02/09/18   Aldean Baker, NP    Family History History reviewed. No pertinent family history.  Social History Social History   Tobacco Use  . Smoking status: Current Every Day Smoker    Packs/day: 0.50  . Smokeless tobacco: Never Used  Substance Use Topics  . Alcohol use: Yes    Frequency: Never  . Drug use: Yes    Frequency: 7.0 times per week    Types: Cocaine    Comment: Pt stated "I do crack every day" and opiates     Allergies   Penicillin g   Review of Systems Review of Systems  ROS negative except as noted in HPI.    Physical Exam Updated Vital Signs BP 123/80 (BP Location: Right Arm)   Pulse 93   Temp 98.5 F (36.9 C) (Oral)   Resp 18   Ht 5\' 9"  (1.753 m)   Wt 72.5 kg   SpO2 97%   BMI 23.60 kg/m   Physical Exam Constitution: NAD, sitting up in bed HENT: [place]/AT Eyes: eom intact Cardio: RRR, no m/r/g  Respiratory: decreased breath sounds, otherwise CTA, no wheezing rales or rhonchi, non-labored breathing Abdominal:  NTTP, non-distended, soft  MSK: no edema, moving all extremities  Neuro: a&o, flat affect Skin: c/d/i    ED Treatments / Results  Labs (all labs ordered are listed, but only abnormal results are displayed) Labs Reviewed  CBC  BASIC METABOLIC PANEL  MAGNESIUM    EKG EKG Interpretation  Date/Time:  Thursday February 24 2018 18:01:22 EST Ventricular Rate:  89 PR Interval:  162 QRS Duration: 84 QT Interval:  342 QTC Calculation: 416 R Axis:   89 Text Interpretation:  Normal sinus rhythm Normal ECG No significant change since last tracing Confirmed by Melene Plan [phone]) on 02/24/2018 9:47:45 PM   Radiology No results  found.  Procedures Procedures (including critical care time)  Medications Ordered in ED Medications - No data to display   Initial Impression / Assessment and Plan / ED Course  I have reviewed the triage vital signs and the nursing notes.  Pertinent labs & imaging results that were available during my care of the patient were reviewed by me and considered in my medical decision making (see chart for details).    33yo male with history of COPD or asthma and bipolar disorder presenting with cough with associated chest pain and SOB for the past week. Previously visited the hospital three days ago with similar symptoms and negative workup, negative for flu. He has been out of his inhaler and his symptoms seem significant for possible exacerbation of his asthma/COPD. Chest xray does not show pneumonia and vitals are stable. Will provide duonebs and prednisone and obtain labs and reasses after breathing treatment.  Patient states his shortness of breath has improved. He states he will be able to pick up his prednisone and discussed that he needs to take this for four days. Return precautions provided.   Final Clinical Impressions(s) / ED Diagnoses   Final diagnoses:  SOB (shortness of breath)  Chest pain    ED Discharge Orders    None       Yuji Walth A, DO 02/24/18 2342    Melene Plan, DO 02/25/18 1502

## 2018-02-24 NOTE — Discharge Instructions (Addendum)
Please start taking prednisone 40 MG tablet in the morning with breakfast for the next four days.   Please follow-up with your primary care physician so that you can re-establish your necessary medications for your asthma and chronic shortness of breath.

## 2018-02-25 NOTE — ED Notes (Signed)
Patient verbalizes understanding of discharge instructions. Opportunity for questioning and answers were provided. Armband removed by staff, pt discharged from ED ambulatory.   

## 2018-02-26 ENCOUNTER — Other Ambulatory Visit: Payer: Self-pay

## 2018-02-26 ENCOUNTER — Encounter (HOSPITAL_COMMUNITY): Payer: Self-pay

## 2018-02-26 ENCOUNTER — Emergency Department (HOSPITAL_COMMUNITY)
Admission: EM | Admit: 2018-02-26 | Discharge: 2018-02-27 | Disposition: A | Discharge status: Home / Self Care | Payer: Self-pay | Attending: Emergency Medicine | Admitting: Emergency Medicine

## 2018-02-26 DIAGNOSIS — R45851 Suicidal ideations: Secondary | ICD-10-CM | POA: Insufficient documentation

## 2018-02-26 DIAGNOSIS — F17210 Nicotine dependence, cigarettes, uncomplicated: Secondary | ICD-10-CM | POA: Insufficient documentation

## 2018-02-26 DIAGNOSIS — Z79899 Other long term (current) drug therapy: Secondary | ICD-10-CM | POA: Insufficient documentation

## 2018-02-26 DIAGNOSIS — F3112 Bipolar disorder, current episode manic without psychotic features, moderate: Secondary | ICD-10-CM | POA: Insufficient documentation

## 2018-02-26 DIAGNOSIS — F1220 Cannabis dependence, uncomplicated: Secondary | ICD-10-CM | POA: Insufficient documentation

## 2018-02-26 DIAGNOSIS — F1410 Cocaine abuse, uncomplicated: Secondary | ICD-10-CM | POA: Insufficient documentation

## 2018-02-26 LAB — CBC WITH DIFFERENTIAL/PLATELET
Abs Immature Granulocytes: 0.01 10*3/uL (ref 0.00–0.07)
Basophils Absolute: 0 10*3/uL (ref 0.0–0.1)
Basophils Relative: 1 %
Eosinophils Absolute: 0 10*3/uL (ref 0.0–0.5)
Eosinophils Relative: 1 %
HEMATOCRIT: 46.5 % (ref 39.0–52.0)
Hemoglobin: 14.4 g/dL (ref 13.0–17.0)
Immature Granulocytes: 0 %
LYMPHS ABS: 1.8 10*3/uL (ref 0.7–4.0)
Lymphocytes Relative: 32 %
MCH: 29.3 pg (ref 26.0–34.0)
MCHC: 31 g/dL (ref 30.0–36.0)
MCV: 94.7 fL (ref 80.0–100.0)
Monocytes Absolute: 0.5 10*3/uL (ref 0.1–1.0)
Monocytes Relative: 9 %
Neutro Abs: 3.2 10*3/uL (ref 1.7–7.7)
Neutrophils Relative %: 57 %
Platelets: 159 10*3/uL (ref 150–400)
RBC: 4.91 MIL/uL (ref 4.22–5.81)
RDW: 12.7 % (ref 11.5–15.5)
WBC: 5.6 10*3/uL (ref 4.0–10.5)
nRBC: 0 % (ref 0.0–0.2)

## 2018-02-26 LAB — RAPID URINE DRUG SCREEN, HOSP PERFORMED
AMPHETAMINES: NOT DETECTED
Barbiturates: NOT DETECTED
Benzodiazepines: NOT DETECTED
Cocaine: POSITIVE — AB
Opiates: NOT DETECTED
TETRAHYDROCANNABINOL: POSITIVE — AB

## 2018-02-26 LAB — BASIC METABOLIC PANEL
Anion gap: 10 (ref 5–15)
BUN: <5 mg/dL — ABNORMAL LOW (ref 6–20)
CHLORIDE: 99 mmol/L (ref 98–111)
CO2: 25 mmol/L (ref 22–32)
Calcium: 9.2 mg/dL (ref 8.9–10.3)
Creatinine, Ser: 0.93 mg/dL (ref 0.61–1.24)
GFR calc Af Amer: >60 mL/min (ref >60)
GFR calc non Af Amer: >60 mL/min (ref >60)
Glucose, Bld: 109 mg/dL — ABNORMAL HIGH (ref 70–99)
Potassium: 3.8 mmol/L (ref 3.5–5.1)
Sodium: 134 mmol/L — ABNORMAL LOW (ref 135–145)

## 2018-02-26 LAB — ETHANOL: Alcohol, Ethyl (B): <10 mg/dL (ref <10)

## 2018-02-26 LAB — SALICYLATE LEVEL: Salicylate Lvl: <7 mg/dL (ref 2.8–30.0)

## 2018-02-26 LAB — ACETAMINOPHEN LEVEL: Acetaminophen (Tylenol), Serum: <10 ug/mL — ABNORMAL LOW (ref 10–30)

## 2018-02-26 MED ORDER — ALBUTEROL SULFATE HFA 108 (90 BASE) MCG/ACT IN AERS: 2.0000 | INHALATION_SPRAY | Freq: Four times a day (QID) | RESPIRATORY_TRACT | Status: DC | PRN

## 2018-02-26 MED ORDER — PREDNISONE 20 MG PO TABS
60.0000 mg | ORAL_TABLET | Freq: Every day | ORAL | Status: DC
  Administered 2018-02-27: 60 mg via ORAL
  Filled 2018-02-26: qty 3

## 2018-02-26 NOTE — ED Notes (Signed)
Pt wanded by security. 

## 2018-02-26 NOTE — ED Triage Notes (Signed)
Pt from home; c/o SI; pt states he did cocaine early this am and began having a nosebleed; pt states "I just want to black out"; plan to hang self; nosebleed controlled at this time; also c/o HA  that began this afternoon

## 2018-02-26 NOTE — ED Notes (Signed)
Pt in recliners in green zone

## 2018-02-26 NOTE — BH Assessment (Signed)
[facility] Assessment Progress Note   Pt will be moved to purple zone soon.  Nurse Debbe Bales said patient should be ready in about 20 min.  TTS to complete assessment.

## 2018-02-26 NOTE — ED Provider Notes (Signed)
[facility] EMERGENCY DEPARTMENT Provider Note   CSN: [id number] Arrival date & time: 02/26/18  1516     History   Chief Complaint No chief complaint on file.   HPI Micheal Walker is a 33 y.o. male.  HPI   Patient is a 33 year old male with history of bipolar 1 disorder, cocaine use disorder, who presents to the emergency department today for evaluation of suicidal ideations.  Triage note indicates that patient complained of epistaxis and a headache.  On my evaluation his nose is no longer bleeding.  He states he had a nosebleed several hours ago but this is since resolved.  He states he also had a migraine earlier in the day that is now resolved.  He denies any neurologic symptoms including no dizziness, lightheadedness, vision changes, numbness or weakness to the arms or legs.  His only complaint is that he woke up today feeling suicidal in states he has a plan to wrap a cord around his neck.  He states he is never had symptoms like this before.  He denies homicidal ideations or AVH.  States he uses cocaine and last used this morning.  Denies other drug use.  Denies EtOH use.  Does smoke tobacco.  States that he used to be on medications for bipolar disorder but he has not taken them in about 2 months.  Past Medical History:  Diagnosis Date  . Bipolar 1 disorder [facility])     Patient Active Problem List   Diagnosis Date Noted  . Cocaine use disorder, severe, dependence (HCC) 02/06/2018  . Opioid dependence with opioid-induced mood disorder (HCC)   . Schizoaffective disorder (HCC) 02/05/2018    History reviewed. No pertinent surgical history.     Home Medications    Prior to Admission medications   Medication Sig Start Date End Date Taking? Authorizing Provider  albuterol (PROVENTIL HFA;VENTOLIN HFA) 108 (90 Base) MCG/ACT inhaler Inhale 2 puffs into the lungs every 6 (six) hours as needed for wheezing or shortness of breath. 02/09/18   Aldean Baker, NP    elvitegravir-cobicistat-emtricitabine-tenofovir (GENVOYA) 150-150-200-10 MG TABS tablet Take 1 tablet by mouth daily with breakfast. 02/10/18   Linwood Dibbles, MD  hydrOXYzine (ATARAX/VISTARIL) 25 MG tablet Take 1 tablet (25 mg total) by mouth 3 (three) times daily as needed for anxiety. 02/09/18   Aldean Baker, NP  mirtazapine (REMERON) 7.5 MG tablet Take 1 tablet (7.5 mg total) by mouth at bedtime. For mood 02/09/18   Aldean Baker, NP  nicotine (NICODERM CQ - DOSED IN MG/24 HOURS) 21 mg/24hr patch Place 1 patch (21 mg total) onto the skin daily. (May buy over the counter) 02/09/18   Aldean Baker, NP  predniSONE (DELTASONE) 20 MG tablet Take 1 tablet (20 mg total) by mouth daily with breakfast. 02/24/18   Seawell, Jaimie A, DO  risperiDONE (RISPERDAL M-TABS) 1 MG disintegrating tablet Take 1 tablet (1 mg total) by mouth 2 (two) times daily. For mood/hallucinations 02/09/18   Aldean Baker, NP    Family History History reviewed. No pertinent family history.  Social History Social History   Tobacco Use  . Smoking status: Current Every Day Smoker    Packs/day: 0.50  . Smokeless tobacco: Never Used  Substance Use Topics  . Alcohol use: Not Currently    Frequency: Never  . Drug use: Yes    Frequency: 7.0 times per week    Types: Cocaine    Comment: Pt stated "I do crack every day"  and opiates     Allergies   Penicillin g   Review of Systems Review of Systems  Constitutional: Negative for fever.  HENT: Positive for nosebleeds (resolved). Negative for ear pain and sore throat.   Eyes: Negative for visual disturbance.  Respiratory: Positive for cough. Negative for shortness of breath.   Cardiovascular: Negative for chest pain.  Gastrointestinal: Negative for abdominal pain and vomiting.  Genitourinary: Negative for dysuria and hematuria.  Musculoskeletal: Negative for back pain.  Skin: Negative for rash.  Neurological: Positive for headaches (resolved). Negative for dizziness,  weakness, light-headedness and numbness.  Psychiatric/Behavioral: Positive for suicidal ideas. Negative for hallucinations.  All other systems reviewed and are negative.   Physical Exam Updated Vital Signs BP 128/87 (BP Location: Right Arm)   Pulse 62   Temp 98.3 F (36.8 C) (Oral)   Resp 16   Ht 5\' 9"  (1.753 m)   Wt 74.8 kg   SpO2 96%   BMI 24.37 kg/m   Physical Exam Vitals signs and nursing note reviewed.  Constitutional:      Appearance: He is well-developed.  HENT:     Head: Normocephalic and atraumatic.     Nose: Nose normal.     Comments: No bleeding from bilateral nares. Eyes:     Extraocular Movements: Extraocular movements intact.     Conjunctiva/sclera: Conjunctivae normal.     Pupils: Pupils are equal, round, and reactive to light.  Neck:     Musculoskeletal: Neck supple.  Cardiovascular:     Rate and Rhythm: Normal rate and regular rhythm.     Heart sounds: Normal heart sounds. No murmur.  Pulmonary:     Effort: Pulmonary effort is normal. No respiratory distress.     Breath sounds: Normal breath sounds. No wheezing or rhonchi.  Abdominal:     General: Bowel sounds are normal.     Palpations: Abdomen is soft.     Tenderness: There is no abdominal tenderness.  Skin:    General: Skin is warm and dry.  Neurological:     Mental Status: He is alert.     Coordination: Coordination normal.     Gait: Gait normal.  Psychiatric:        Mood and Affect: Mood normal.      ED Treatments / Results  Labs (all labs ordered are listed, but only abnormal results are displayed) Labs Reviewed  BASIC METABOLIC PANEL - Abnormal; Notable for the following components:      Result Value   Sodium 134 (*)    Glucose, Bld 109 (*)    BUN <5 (*)    All other components within normal limits  RAPID URINE DRUG SCREEN, HOSP PERFORMED - Abnormal; Notable for the following components:   Cocaine POSITIVE (*)    Tetrahydrocannabinol POSITIVE (*)    All other components within  normal limits  ACETAMINOPHEN LEVEL - Abnormal; Notable for the following components:   Acetaminophen (Tylenol), Serum <10 (*)    All other components within normal limits  CBC WITH DIFFERENTIAL/PLATELET  ETHANOL  SALICYLATE LEVEL    EKG None  Radiology No results found.  Procedures Procedures (including critical care time)  Medications Ordered in ED Medications  albuterol (PROVENTIL HFA;VENTOLIN HFA) 108 (90 Base) MCG/ACT inhaler 2 puff (has no administration in time range)  predniSONE (DELTASONE) tablet 60 mg (has no administration in time range)     Initial Impression / Assessment and Plan / ED Course  I have reviewed the triage vital signs and  the nursing notes.  Pertinent labs & imaging results that were available during my care of the patient were reviewed by me and considered in my medical decision making (see chart for details).     Final Clinical Impressions(s) / ED Diagnoses   Final diagnoses:  Suicidal ideation   Patient presenting to the ED complain of suicidal ideations that started today after using cocaine.  Endorses plan to kill self by wrapping a cord around his neck.  Denies homicidal intentions.  Denies AVH.  Denies EtOH use.  Vital signs stable.  Patient in no distress.    Labs are reassuring.  EtOH, salicylate and acetaminophen levels are negative.  Patient without acute medical pathology at this time that would require further treatment or intervention at this time.  He is appropriate for evaluation by behavioral health at this time.  Behavioral health evaluated the patient and recommends overnight observation with a.m. reevaluation.  Home meds ordered.  Patient care transition to default provider at shift change.  ED Discharge Orders    None       Rayne DuCouture, Rheda Kassab S, PA-C 02/26/18 2224    Rolan BuccoBelfi, Melanie, MD 03/05/18 1100

## 2018-02-26 NOTE — ED Notes (Signed)
Dinner tray at bedside

## 2018-02-26 NOTE — BH Assessment (Signed)
Tele Assessment Note   Patient Name: Micheal Walker MRN: [id number] Referring Physician: Leonia Corona, PA Location of Patient: [facility] Location of Provider: Behavioral Health TTS Department  Micheal Walker is an 33 y.o. male.  -Clinician reviewed note by Micheal Corona, PA. His only complaint is that he woke up today feeling suicidal in states he has a plan to wrap a cord around his neck.  He states he is never had symptoms like this before.  He denies homicidal ideations or AVH.  States he uses cocaine and last used this morning.  Denies other drug use.  Denies EtOH use.  Does smoke tobacco.  States that he used to be on medications for bipolar disorder but he has not taken them in about 2 months.  Patient says that he snorted some cocaine this morning for the first time in 3 months.  He ended up having a nosebleed and HA from it.  He says he wants help getting off drugs.  Patient reports walking across town to get to [facility].  This is patient's 3rd visit to hospital in  6 days.  Patient reports he woke up this morning feeling anxious and suicidal.  Patient says he has plan to choke himself with a extension cord.  He admits to not having one readily available due to being homeless but he could get one.  Patient denies any HI or A/V hallucinations.  Patient used cocaine this AM for the first time in 3 months.  He smokes 1-2 joints of marijuana daily however.  Last use of marijuana was yesterday.  Patient is alert and oriented x4.  He has good eye contact.  He reports increased anxiety and poor sleep.  Patient says he did not have any collateral contacts for clinician.  Patient wants to get into a rehab facility and feels he cannot do it by himself.  -Clinician discussed patient care with Micheal Conn, FNP.  He recommends overnight observation with AM psych evaluation.  Clinician informed Micheal Corona, PA of disposition.   Diagnosis: F31.12 Bipolar 1 d/o most recent episode manic moderate;  F14.10 Cocaine use d/o mild; F12.20 Cannabis use d/o severe  Past Medical History:  Past Medical History:  Diagnosis Date  . Bipolar 1 disorder (HCC)     No past surgical history on file.  Family History: No family history on file.  Social History:  reports that he has been smoking. He has been smoking about 0.50 packs per day. He has never used smokeless tobacco. He reports previous alcohol use. He reports current drug use. Frequency: 7.00 times per week. Drug: Cocaine.  Additional Social History:  Alcohol / Drug Use Pain Medications: None Prescriptions: None in a month and a half. Over the Counter: None History of alcohol / drug use?: Yes Longest period of sobriety (when/how long): Two weeks Substance #1 Name of Substance 1: Cocaine 1 - Age of First Use: 33 years of age 78 - Amount (size/oz): About two grams 1 - Frequency: Today was first time in 3 months 1 - Duration: off and on 1 - Last Use / Amount: 02/15 Substance #2 Name of Substance 2: Marijuana 2 - Age of First Use: 33 years of age 48 - Amount (size/oz): Joint or two a day 2 - Frequency: Daily 2 - Duration: on-going 2 - Last Use / Amount: 02/14  CIWA: CIWA-Ar BP: 128/87 Pulse Rate: 62 COWS:    Allergies:  Allergies  Allergen Reactions  . Penicillin G Anaphylaxis    DID THE  REACTION INVOLVE: Swelling of the face/tongue/throat, SOB, or low BP? Yes Sudden or severe rash/hives, skin peeling, or the inside of the mouth or nose? Yes Did it require medical treatment? Yes When did it last happen?05/2017 If all above answers are "NO", may proceed with cephalosporin use.    Home Medications: (Not in a hospital admission)   OB/GYN Status:  No LMP for male patient.  General Assessment Data Assessment unable to be completed: Yes Reason for not completing assessment: Pt being moved to purple zone. Location of Assessment: [facility] ED TTS Assessment: In system Is this a Tele or Face-to-Face Assessment?: Tele  Assessment Is this an Initial Assessment or a Re-assessment for this encounter?: Initial Assessment Patient Accompanied by:: N/A Language Other than English: No Living Arrangements: Homeless/Shelter What gender do you identify as?: Male Marital status: Married Pregnancy Status: No Living Arrangements: Other (Comment)(Pt is homeless.) Can pt return to current living arrangement?: Yes Admission Status: Voluntary Is patient capable of signing voluntary admission?: Yes Referral Source: Self/Family/Friend(Pt walked to [facility].) Insurance type: self pay     Crisis Care Plan Living Arrangements: Other (Comment)(Pt is homeless.) Name of Psychiatrist: None Name of Therapist: none  Education Status Is patient currently in school?: No Highest grade of school patient has completed: some college. Is the patient employed, unemployed or receiving disability?: Unemployed  Risk to self with the past 6 months Suicidal Ideation: Yes-Currently Present Has patient been a risk to self within the past 6 months prior to admission? : Yes Suicidal Intent: Yes-Currently Present Has patient had any suicidal intent within the past 6 months prior to admission? : Yes Is patient at risk for suicide?: Yes Suicidal Plan?: Yes-Currently Present Has patient had any suicidal plan within the past 6 months prior to admission? : Yes Specify Current Suicidal Plan: Hang self w/ extension cord. Access to Means: No Specify Access to Suicidal Means: Says he was going to find one. What has been your use of drugs/alcohol within the last 12 months?: Cocaine & marijuana Previous Attempts/Gestures: Yes How many times?: 3 Other Self Harm Risks: SA issues Triggers for Past Attempts: Other personal contacts Intentional Self Injurious Behavior: None Family Suicide History: No Recent stressful life event(s): Financial Problems, Other (Comment)(Homelessness) Persecutory voices/beliefs?: No Depression: Yes Depression Symptoms:  Despondent, Guilt Substance abuse history and/or treatment for substance abuse?: Yes Suicide prevention information given to non-admitted patients: Not applicable  Risk to Others within the past 6 months Homicidal Ideation: No Does patient have any lifetime risk of violence toward others beyond the six months prior to admission? : No Thoughts of Harm to Others: No Current Homicidal Intent: No Current Homicidal Plan: No Access to Homicidal Means: No Identified Victim: No one History of harm to others?: No Assessment of Violence: None Noted Violent Behavior Description: Pt denies Does patient have access to weapons?: No Criminal Charges Pending?: No Does patient have a court date: No Is patient on probation?: No  Psychosis Hallucinations: None noted Delusions: None noted  Mental Status Report Appearance/Hygiene: Unremarkable, In scrubs Eye Contact: Good Motor Activity: Freedom of movement, Unremarkable Speech: Logical/coherent Level of Consciousness: Alert Mood: Depressed, Sad Affect: Sad Anxiety Level: Minimal Thought Processes: Coherent, Relevant Judgement: Impaired Orientation: Person, Place, Situation, Time Obsessive Compulsive Thoughts/Behaviors: None  Cognitive Functioning Concentration: Decreased Memory: Recent Impaired, Remote Intact Is patient IDD: No Insight: Poor Impulse Control: Poor Appetite: Fair Have you had any weight changes? : No Change Sleep: Decreased Total Hours of Sleep: 3 Vegetative Symptoms: None  ADLScreening (  [facility]) Patient's cognitive ability adequate to safely complete daily activities?: Yes Patient able to express need for assistance with ADLs?: Yes Independently performs ADLs?: Yes (appropriate for developmental age)  Prior Inpatient Therapy Prior Inpatient Therapy: Yes Prior Therapy Dates: 02/05/18 [facility] Reason for Treatment: SI, SA  Prior Outpatient Therapy Prior Outpatient  Therapy: Yes Prior Therapy Dates: 2019 Prior Therapy Facilty/Provider(s): [facility] Reason for Treatment: Schizoaffective disorder Does patient have an ACCT team?: No Does patient have Intensive In-House Services?  : No Does patient have [facility] services? : No Does patient have P4CC services?: No  ADL Screening (condition at time of admission) Patient's cognitive ability adequate to safely complete daily activities?: Yes Is the patient deaf or have difficulty hearing?: No Does the patient have difficulty seeing, even when wearing glasses/contacts?: No Does the patient have difficulty concentrating, remembering, or making decisions?: No Patient able to express need for assistance with ADLs?: Yes Does the patient have difficulty dressing or bathing?: No Independently performs ADLs?: Yes (appropriate for developmental age) Does the patient have difficulty walking or climbing stairs?: No Weakness of Legs: Right Weakness of Arms/Hands: None       Abuse/Neglect Assessment (Assessment to be complete while patient is alone) Physical Abuse: Denies Verbal Abuse: Denies Sexual Abuse: Denies Exploitation of patient/patient's resources: Denies Self-Neglect: Denies     Merchant navy officerAdvance Directives (For Healthcare) Does Patient Have a Medical Advance Directive?: No Would patient like information on creating a medical advance directive?: No - Patient declined          Disposition:  Disposition Initial Assessment Completed for this Encounter: Yes Patient referred to: Other (Comment)(Review with FNP)  This service was provided via telemedicine using a 2-way, interactive audio and video technology.  Names of all persons participating in this telemedicine service and their role in this encounter. Name: Cyndy FreezeDeshawn Mattie Role: patient  Name: Beatriz StallionMarcus Desani Sprung, M.S. LCAS QP Role: clinician  Name:  Role:   Name:  Role:     Alexandria LodgeHarvey, Rian Koon Ray 02/26/2018 8:08 PM

## 2018-02-26 NOTE — ED Notes (Signed)
Pt changed into purple scrubs; belongings in bags at nurses station to be inventoried

## 2018-02-26 NOTE — ED Notes (Signed)
Pt moved to purple zone, room 48. Dressed in purple scrubs. Sitter with pt. TTS in progress.

## 2018-02-27 ENCOUNTER — Encounter (HOSPITAL_COMMUNITY): Payer: Self-pay

## 2018-02-27 ENCOUNTER — Emergency Department (HOSPITAL_COMMUNITY)
Admission: EM | Admit: 2018-02-27 | Discharge: 2018-02-27 | Disposition: A | Discharge status: Home / Self Care | Payer: Self-pay | Attending: Emergency Medicine | Admitting: Emergency Medicine

## 2018-02-27 DIAGNOSIS — R369 Urethral discharge, unspecified: Secondary | ICD-10-CM | POA: Insufficient documentation

## 2018-02-27 DIAGNOSIS — Z79899 Other long term (current) drug therapy: Secondary | ICD-10-CM | POA: Insufficient documentation

## 2018-02-27 DIAGNOSIS — F17210 Nicotine dependence, cigarettes, uncomplicated: Secondary | ICD-10-CM | POA: Insufficient documentation

## 2018-02-27 LAB — URINALYSIS, ROUTINE W REFLEX MICROSCOPIC
Bilirubin Urine: NEGATIVE
GLUCOSE, UA: NEGATIVE mg/dL
Hgb urine dipstick: NEGATIVE
Ketones, ur: NEGATIVE mg/dL
LEUKOCYTE UA: NEGATIVE
Nitrite: NEGATIVE
PROTEIN: NEGATIVE mg/dL
Specific Gravity, Urine: 1.016 (ref 1.005–1.030)
pH: 6 (ref 5.0–8.0)

## 2018-02-27 LAB — CULTURE, BLOOD (ROUTINE X 2)
Culture: NO GROWTH
Special Requests: ADEQUATE

## 2018-02-27 MED ORDER — ONDANSETRON 4 MG PO TBDP: 4.0000 mg | ORAL_TABLET | Freq: Once | ORAL | Status: DC

## 2018-02-27 MED ORDER — GENTAMICIN SULFATE 40 MG/ML IJ SOLN
240.0000 mg | Freq: Once | INTRAMUSCULAR | Status: AC
  Administered 2018-02-27: 240 mg via INTRAMUSCULAR
  Filled 2018-02-27: qty 6

## 2018-02-27 MED ORDER — MIRTAZAPINE 7.5 MG PO TABS: 7.5000 mg | ORAL_TABLET | Freq: Every day | ORAL | 0 refills | Status: AC

## 2018-02-27 MED ORDER — RISPERIDONE 1 MG PO TBDP: 1.0000 mg | ORAL_TABLET | Freq: Two times a day (BID) | ORAL | 0 refills | Status: AC

## 2018-02-27 MED ORDER — HYDROXYZINE HCL 25 MG PO TABS: 25.0000 mg | ORAL_TABLET | Freq: Three times a day (TID) | ORAL | 0 refills | Status: AC | PRN

## 2018-02-27 MED ORDER — AZITHROMYCIN 250 MG PO TABS
2000.0000 mg | ORAL_TABLET | Freq: Once | ORAL | Status: AC
  Administered 2018-02-27: 2000 mg via ORAL
  Filled 2018-02-27: qty 8

## 2018-02-27 NOTE — ED Notes (Signed)
Pt attempted to make phone call from nurses' desk.

## 2018-02-27 NOTE — ED Provider Notes (Signed)
11:10 AM: Patient presented yesterday for suicidal ideations.  Upon my assessment, he tells me he used cocaine after being clean "for a while."  States as the effects of the cocaine were wearing off, he began to "feel down" about his cocaine use.  He states, "I had thoughts of wrapping a cord around my neck or something like that."  He is not having these thoughts currently.   Support system: Patient has been previously seen and treated with [facility].  He has been established at the [facility].  Previous Attempts: Patient denies any previous attempts.  Drug/alcohol use: Used cocaine last night.  Medication compliance: States he has not used his medications for last month because he ran out and has not yet followed up.  Medication changes: N/A  Physical complaints: None upon my assessment.   Physical Exam  Constitutional: He is well-developed, well-nourished, and in no distress. No distress.  HENT:  Head: Normocephalic.  Eyes: Conjunctivae are normal.  Neck: Neck supple.  Cardiovascular: Normal rate and regular rhythm.  Pulmonary/Chest: Effort normal.  Abdominal: There is no guarding.  Neurological: He is alert.  Skin: Skin is warm and dry. He is not diaphoretic.  Psychiatric: Mood and affect normal.  Patient is able to readily hold a conversation.  Makes good eye contact.  Appears calm.  Nursing note and vitals reviewed.    Clinical Course as of Feb 28 1132  Sun Feb 27, 2018  0900 Attempted morning rounds on patient. Patient in the shower.   [SJ]  1105 Spoke with Reola Calkins, psychiatric NP.  States he has assessed the patient and will recommend patient for outpatient follow-up at [facility] tomorrow. Would like to make sure I had no disagreements with this.    [SJ]    Clinical Course User Index [SJ] Joy, Shawn C, PA-C    Patient presented with complaint of suicidal ideations last night.  He denies these thoughts today.  During my interview with the patient, he denies any current  complaints.  He has been cleared by the psychiatry team.  Recommended for outpatient follow-up.  We have represcribed his home medications.  Findings and plan of care discussed with Rolan Bucco, MD.      Anselm Pancoast, PA-C 02/27/18 1345    Rolan Bucco, MD 02/27/18 1515

## 2018-02-27 NOTE — Discharge Instructions (Addendum)
You have been tested for chlamydia and gonorrhea.  These results will be available in approximately 3 days and you will be contacted by the hospital if the results are positive. Avoid sexual contact until you are aware of the results, and please inform all sexual partners if you test positive for any of these diseases.  Please follow up with your primary care provider within 5-7 days for re-evaluation of your symptoms. If you do not have a primary care provider, information for a healthcare clinic has been provided for you to make arrangements for follow up care. Please return to the emergency department for any new or worsening symptoms.  

## 2018-02-27 NOTE — ED Provider Notes (Signed)
[facility] EMERGENCY DEPARTMENT Provider Note   CSN: [id number] Arrival date & time: 02/27/18  1800     History   Chief Complaint Chief Complaint  Patient presents with  . Penile Discharge    HPI Micheal Walker is a 33 y.o. male.  HPI   Patient is a 33 year old male with a history of bipolar 1 disorder, cocaine use disorder, schizoaffective disorder who presents emergency department today for complaints of penile discharge.  He states penile discharge has been present for the last several days.  He reports unprotected intercourse recently.  Denies fevers chills.  No abdominal pain nausea vomiting diarrhea or urinary symptoms.  No hematuria frequency or dysuria.  He declines testing for HIV and syphilis.  He would like to be prophylactically treated for gonorrhea and chlamydia.   Past Medical History:  Diagnosis Date  . Bipolar 1 disorder [facility])     Patient Active Problem List   Diagnosis Date Noted  . Cocaine use disorder, severe, dependence (HCC) 02/06/2018  . Opioid dependence with opioid-induced mood disorder (HCC)   . Schizoaffective disorder (HCC) 02/05/2018    History reviewed. No pertinent surgical history.      Home Medications    Prior to Admission medications   Medication Sig Start Date End Date Taking? Authorizing Provider  albuterol (PROVENTIL HFA;VENTOLIN HFA) 108 (90 Base) MCG/ACT inhaler Inhale 2 puffs into the lungs every 6 (six) hours as needed for wheezing or shortness of breath. 02/09/18   Aldean Baker, NP  elvitegravir-cobicistat-emtricitabine-tenofovir (GENVOYA) 150-150-200-10 MG TABS tablet Take 1 tablet by mouth daily with breakfast. 02/10/18   Linwood Dibbles, MD  hydrOXYzine (ATARAX/VISTARIL) 25 MG tablet Take 1 tablet (25 mg total) by mouth 3 (three) times daily as needed for anxiety. 02/27/18   Joy, Shawn C, PA-C  mirtazapine (REMERON) 7.5 MG tablet Take 1 tablet (7.5 mg total) by mouth at bedtime. For mood 02/27/18   Joy, Shawn  C, PA-C  nicotine (NICODERM CQ - DOSED IN MG/24 HOURS) 21 mg/24hr patch Place 1 patch (21 mg total) onto the skin daily. (May buy over the counter) 02/09/18   Aldean Baker, NP  predniSONE (DELTASONE) 20 MG tablet Take 1 tablet (20 mg total) by mouth daily with breakfast. 02/24/18   Seawell, Jaimie A, DO  risperiDONE (RISPERDAL M-TABS) 1 MG disintegrating tablet Take 1 tablet (1 mg total) by mouth 2 (two) times daily. For mood/hallucinations 02/27/18   Joy, Hillard Danker, PA-C    Family History History reviewed. No pertinent family history.  Social History Social History   Tobacco Use  . Smoking status: Current Every Day Smoker    Packs/day: 0.50  . Smokeless tobacco: Never Used  Substance Use Topics  . Alcohol use: Not Currently    Frequency: Never  . Drug use: Yes    Frequency: 7.0 times per week    Types: Cocaine    Comment: Pt stated "I do crack every day" and opiates     Allergies   Penicillin g   Review of Systems Review of Systems  Constitutional: Negative for fever.  Gastrointestinal: Negative for abdominal pain, constipation, diarrhea, nausea, rectal pain and vomiting.  Genitourinary: Positive for discharge. Negative for dysuria, frequency, penile pain, penile swelling, scrotal swelling, testicular pain and urgency.     Physical Exam Updated Vital Signs BP 125/78 (BP Location: Right Arm)   Pulse 90   Temp 98 F (36.7 C) (Oral)   Resp 17   SpO2 98%   Physical  Exam Vitals signs and nursing note reviewed.  Constitutional:      Appearance: He is well-developed.  HENT:     Head: Normocephalic and atraumatic.  Eyes:     Conjunctiva/sclera: Conjunctivae normal.  Neck:     Musculoskeletal: Neck supple.  Cardiovascular:     Rate and Rhythm: Normal rate and regular rhythm.     Heart sounds: No murmur.  Pulmonary:     Effort: Pulmonary effort is normal. No respiratory distress.     Breath sounds: Normal breath sounds.  Abdominal:     Palpations: Abdomen is soft.       Tenderness: There is no abdominal tenderness.  Genitourinary:    Comments: Chaperone present.  Normal uncircumcised penis without any evidence of penile discharge.  No obvious erythema or swelling to the testicles.  Patient has an erection. Skin:    General: Skin is warm and dry.  Neurological:     Mental Status: He is alert.      ED Treatments / Results  Labs (all labs ordered are listed, but only abnormal results are displayed) Labs Reviewed  URINE CULTURE  URINALYSIS, ROUTINE W REFLEX MICROSCOPIC  GC/CHLAMYDIA PROBE AMP ([place]) NOT AT [facility]    EKG None  Radiology No results found.  Procedures Procedures (including critical care time)  Medications Ordered in ED Medications  azithromycin (ZITHROMAX) tablet 2,000 mg (has no administration in time range)  gentamicin (GARAMYCIN) injection 240 mg (has no administration in time range)  ondansetron (ZOFRAN-ODT) disintegrating tablet 4 mg (has no administration in time range)     Initial Impression / Assessment and Plan / ED Course  I have reviewed the triage vital signs and the nursing notes.  Pertinent labs & imaging results that were available during my care of the patient were reviewed by me and considered in my medical decision making (see chart for details).     Final Clinical Impressions(s) / ED Diagnoses   Final diagnoses:  Penile discharge   Patient is afebrile without abdominal tenderness, abdominal pain or painful bowel movements to indicate prostatitis.  No tenderness to palpation of the testes or epididymis to suggest orchitis or epididymitis.  STD cultures obtained including gonorrhea and chlamydia. Patient to be discharged with instructions to follow up with PCP. Discussed importance of using protection when sexually active. Pt understands that they have GC/Chlamydia cultures pending and that they will need to inform all sexual partners if results return positive. Patient has been treated  prophylactically with azithromycin and and gentamicin given patient's history of penicillin allergy.   ED Discharge Orders    None       Rayne Du 02/27/18 2009    Rolan Bucco, MD 03/05/18 1100

## 2018-02-27 NOTE — [facility] Counselor (Signed)
Per Burnetta Sabin, NP, Pt is now psych-cleared.

## 2018-02-27 NOTE — Discharge Instructions (Signed)
You are encouraged to begin taking your medications, as prescribed. Follow-up with [facility] tomorrow morning. You may return to the ED at anytime for recurrence or worsening of suicidal ideations.

## 2018-02-27 NOTE — ED Notes (Signed)
Pt lying on bed watching tv. Pt showered.

## 2018-02-27 NOTE — Progress Notes (Addendum)
Patient seen by me via tele-psych and I have consulted with Dr. Jola Babinski.  Patient reports that he is suicidal and had thoughts previously about hanging himself.  He reports that the biggest reason he wants to come to the hospital to get help with his substance abuse.  He states that he is homeless and does not have anywhere to stay.  He denies any homicidal ideations and denies any hallucinations. Patient has a long history of frequent ED visits as well as hospitalizations.  Last hospitalization was at [facility] on 02/05/2018.  Patient has numerous ED visits and has chronic suicidal reports. Patient has continually refused to follow-up with any outpatient resources.  When patient was questioned why he stated that he would relapse and would never go.  He has had been offered AA, NA, [facility], and [facility].  Patient continues to be noncompliant with his treatments.  Patient presents in the room and has been very appropriate and has been smiling with the nursing staff.  Patient does not appear to be manic nor show any signs of psychosis.  Patient seems to be seeking secondary gain from the hospital.  Patient reports continued drug abuse with last use of cocaine yesterday morning and he was also positive for cannabis on his UDS.  Patient reports that usually his drug of choice is opiates but is negative and states that heroin has become too expensive for him to buy so he started buying cocaine.  He reported that the reason he initially came to the hospital was because of snorting cocaine and started having a nosebleed.  At this time patient does not meet inpatient criteria and is psychiatrically cleared.  I have contacted Harolyn Rutherford, PA-C and notified him of the recommendations.

## 2018-02-27 NOTE — ED Notes (Signed)
ALL belongings - 3 labeled belongings bags and 1 Valuables Envelope - returned to pt - Pt signed verifying all items present.

## 2018-02-27 NOTE — ED Triage Notes (Signed)
Pt from home c/o yellowish penile discharge x 2 days. Pt endorses recent unprotected sex. Pt denies pain/sores. Pt alert, oriented, and ambulatory

## 2018-02-27 NOTE — ED Notes (Addendum)
Rx's x 3 given w/D/C instructions discussed and given to pt. Pt voiced understanding to follow up w/[facility]. Bus pass given as requested.

## 2018-03-01 LAB — URINE CULTURE: Culture: <10000 — AB

## 2018-03-01 LAB — GC/CHLAMYDIA PROBE AMP (~~LOC~~) NOT AT ARMC
Chlamydia: NEGATIVE
Neisseria Gonorrhea: NEGATIVE

## 2020-09-09 IMAGING — CR DG CHEST 2V
2 series · 2 of 2 positions shown · non-contrast
Comparison: None.

CLINICAL DATA: Cough 2 weeks.

EXAM:
CHEST - 2 VIEW

[w chest pa]
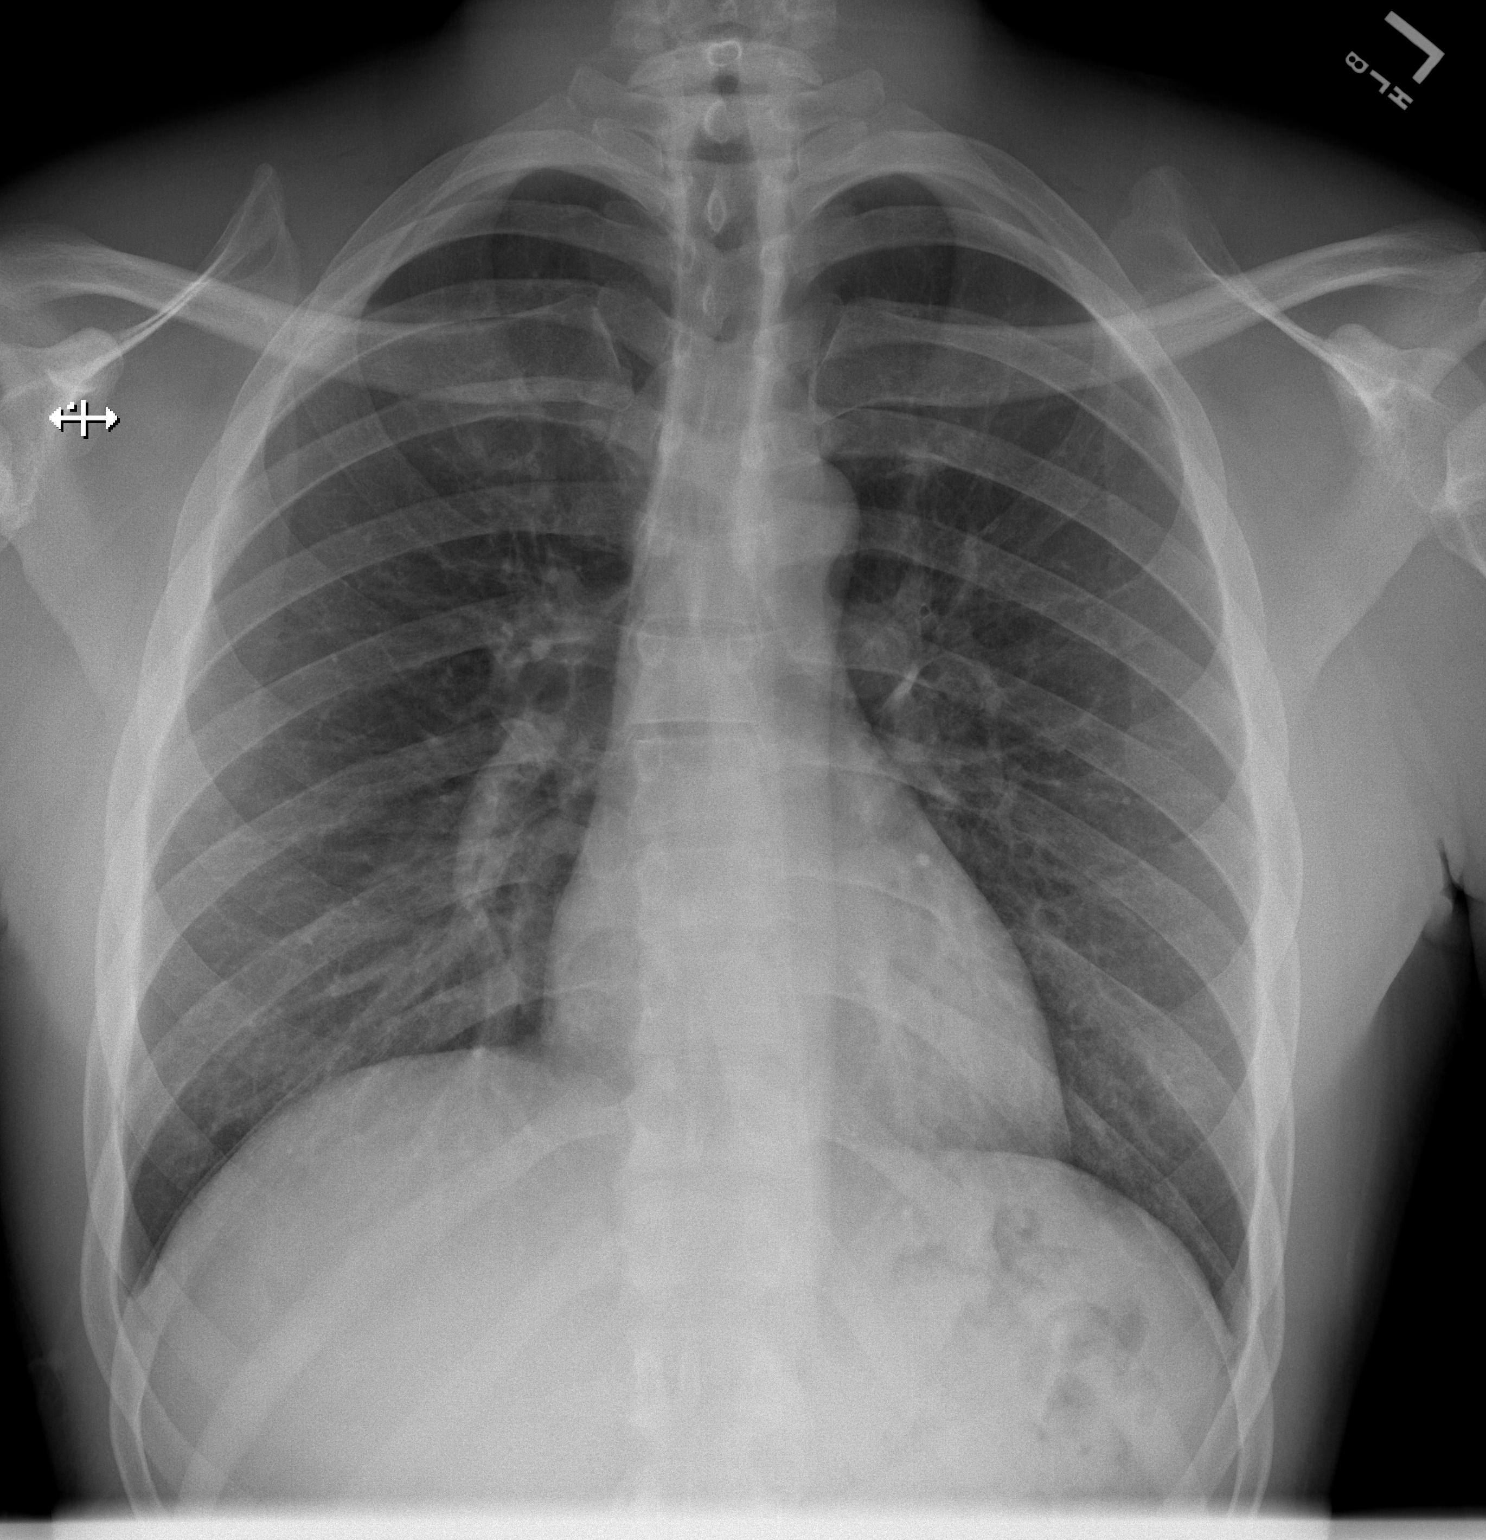

[w chest lat]
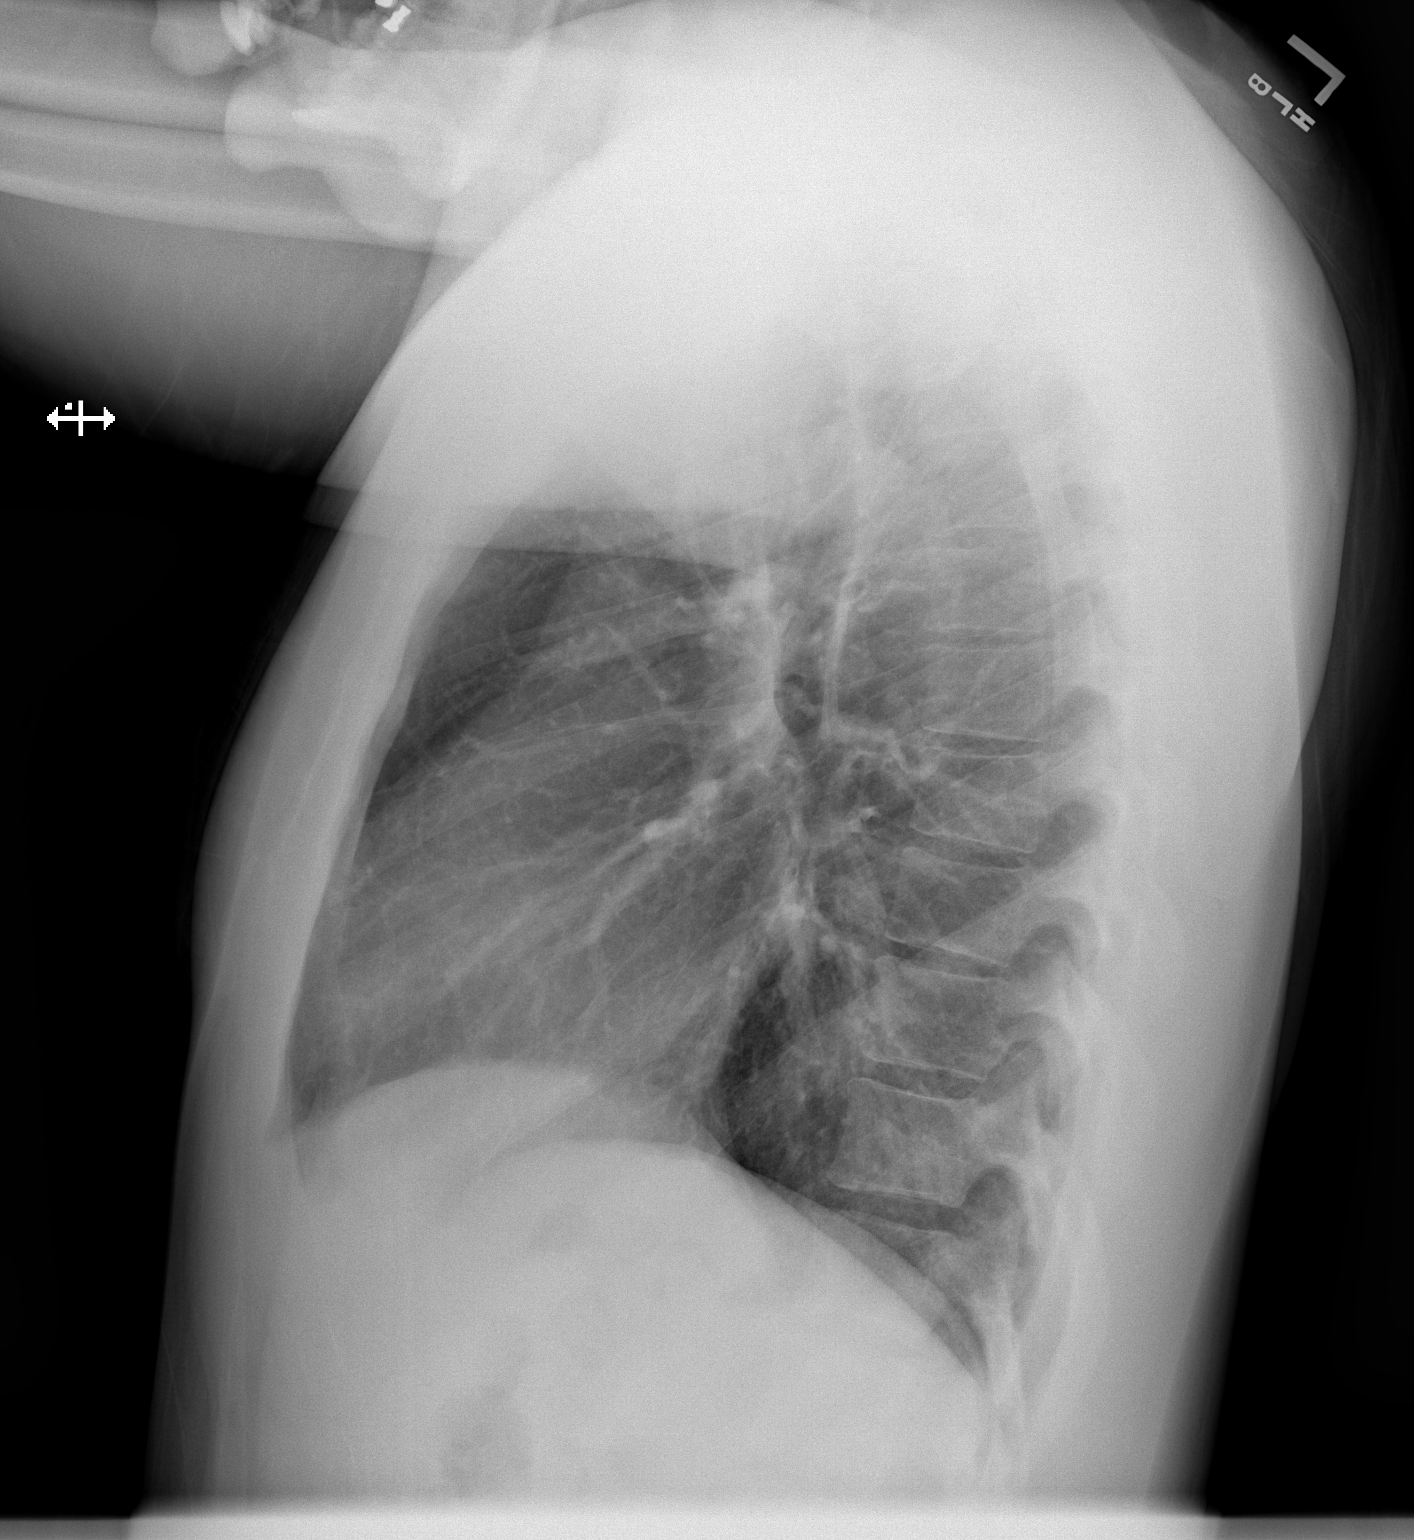

[2 of 2 positions shown; findings below may reference images not displayed]

FINDINGS: The heart size and mediastinal contours are within normal limits.
Both lungs are clear. The visualized skeletal structures are
unremarkable.
IMPRESSION: No active cardiopulmonary disease.
